# Patient Record
Sex: Male | Born: 1945 | Race: Black or African American | Hispanic: No | Smoking: Never smoker
Health system: Southern US, Community
[De-identification: ages and names within clinical notes are randomized; demographics above are authoritative.]

## PROBLEM LIST (undated history)

## (undated) DIAGNOSIS — E119 Type 2 diabetes mellitus without complications: Secondary | ICD-10-CM

## (undated) DIAGNOSIS — I10 Essential (primary) hypertension: Secondary | ICD-10-CM

## (undated) DIAGNOSIS — E079 Disorder of thyroid, unspecified: Secondary | ICD-10-CM

## (undated) DIAGNOSIS — G629 Polyneuropathy, unspecified: Secondary | ICD-10-CM

## (undated) HISTORY — PX: COLONOSCOPY W/ POLYPECTOMY: SHX1380

---

## 2019-07-18 ENCOUNTER — Emergency Department (HOSPITAL_BASED_OUTPATIENT_CLINIC_OR_DEPARTMENT_OTHER): Payer: Medicare Other

## 2019-07-18 ENCOUNTER — Other Ambulatory Visit: Payer: Self-pay

## 2019-07-18 ENCOUNTER — Inpatient Hospital Stay (HOSPITAL_BASED_OUTPATIENT_CLINIC_OR_DEPARTMENT_OTHER)
Admission: EM | Admit: 2019-07-18 | Discharge: 2019-07-20 | DRG: 149 | Disposition: A | Discharge status: Home / Self Care | Payer: Medicare Other | Attending: Internal Medicine | Admitting: Internal Medicine

## 2019-07-18 ENCOUNTER — Encounter (HOSPITAL_BASED_OUTPATIENT_CLINIC_OR_DEPARTMENT_OTHER): Payer: Self-pay | Admitting: Emergency Medicine

## 2019-07-18 DIAGNOSIS — N402 Nodular prostate without lower urinary tract symptoms: Secondary | ICD-10-CM | POA: Diagnosis present

## 2019-07-18 DIAGNOSIS — Z87891 Personal history of nicotine dependence: Secondary | ICD-10-CM

## 2019-07-18 DIAGNOSIS — Z7984 Long term (current) use of oral hypoglycemic drugs: Secondary | ICD-10-CM | POA: Diagnosis not present

## 2019-07-18 DIAGNOSIS — E1140 Type 2 diabetes mellitus with diabetic neuropathy, unspecified: Secondary | ICD-10-CM | POA: Diagnosis not present

## 2019-07-18 DIAGNOSIS — E785 Hyperlipidemia, unspecified: Secondary | ICD-10-CM | POA: Diagnosis present

## 2019-07-18 DIAGNOSIS — E039 Hypothyroidism, unspecified: Secondary | ICD-10-CM | POA: Diagnosis not present

## 2019-07-18 DIAGNOSIS — I10 Essential (primary) hypertension: Secondary | ICD-10-CM | POA: Diagnosis present

## 2019-07-18 DIAGNOSIS — Z79899 Other long term (current) drug therapy: Secondary | ICD-10-CM | POA: Diagnosis not present

## 2019-07-18 DIAGNOSIS — R439 Unspecified disturbances of smell and taste: Secondary | ICD-10-CM | POA: Diagnosis present

## 2019-07-18 DIAGNOSIS — R911 Solitary pulmonary nodule: Secondary | ICD-10-CM | POA: Diagnosis not present

## 2019-07-18 DIAGNOSIS — R42 Dizziness and giddiness: Secondary | ICD-10-CM | POA: Diagnosis not present

## 2019-07-18 DIAGNOSIS — E860 Dehydration: Secondary | ICD-10-CM | POA: Diagnosis present

## 2019-07-18 DIAGNOSIS — R0602 Shortness of breath: Secondary | ICD-10-CM

## 2019-07-18 DIAGNOSIS — Z9181 History of falling: Secondary | ICD-10-CM | POA: Diagnosis not present

## 2019-07-18 DIAGNOSIS — R2681 Unsteadiness on feet: Secondary | ICD-10-CM | POA: Diagnosis present

## 2019-07-18 DIAGNOSIS — Z7982 Long term (current) use of aspirin: Secondary | ICD-10-CM | POA: Diagnosis not present

## 2019-07-18 DIAGNOSIS — Z20822 Contact with and (suspected) exposure to covid-19: Secondary | ICD-10-CM | POA: Diagnosis present

## 2019-07-18 DIAGNOSIS — E119 Type 2 diabetes mellitus without complications: Secondary | ICD-10-CM

## 2019-07-18 DIAGNOSIS — R112 Nausea with vomiting, unspecified: Secondary | ICD-10-CM | POA: Diagnosis not present

## 2019-07-18 DIAGNOSIS — R7401 Elevation of levels of liver transaminase levels: Secondary | ICD-10-CM | POA: Diagnosis not present

## 2019-07-18 HISTORY — DX: Disorder of thyroid, unspecified: E07.9

## 2019-07-18 HISTORY — DX: Type 2 diabetes mellitus without complications: E11.9

## 2019-07-18 HISTORY — DX: Essential (primary) hypertension: I10

## 2019-07-18 HISTORY — DX: Polyneuropathy, unspecified: G62.9

## 2019-07-18 LAB — CBC WITH DIFFERENTIAL/PLATELET
Abs Immature Granulocytes: 0.02 10*3/uL (ref 0.00–0.07)
Basophils Absolute: 0 10*3/uL (ref 0.0–0.1)
Basophils Relative: 0 %
Eosinophils Absolute: 0 10*3/uL (ref 0.0–0.5)
Eosinophils Relative: 0 %
HCT: 40.5 % (ref 39.0–52.0)
Hemoglobin: 13.4 g/dL (ref 13.0–17.0)
Immature Granulocytes: 0 %
Lymphocytes Relative: 19 %
Lymphs Abs: 1.4 10*3/uL (ref 0.7–4.0)
MCH: 27.8 pg (ref 26.0–34.0)
MCHC: 33.1 g/dL (ref 30.0–36.0)
MCV: 84 fL (ref 80.0–100.0)
Monocytes Absolute: 0.7 10*3/uL (ref 0.1–1.0)
Monocytes Relative: 10 %
Neutro Abs: 5 10*3/uL (ref 1.7–7.7)
Neutrophils Relative %: 71 %
Platelets: 280 10*3/uL (ref 150–400)
RBC: 4.82 MIL/uL (ref 4.22–5.81)
RDW: 13.6 % (ref 11.5–15.5)
WBC: 7.2 10*3/uL (ref 4.0–10.5)
nRBC: 0 % (ref 0.0–0.2)

## 2019-07-18 LAB — URINALYSIS, ROUTINE W REFLEX MICROSCOPIC
Bilirubin Urine: NEGATIVE
Glucose, UA: 100 mg/dL — AB
Hgb urine dipstick: NEGATIVE
Ketones, ur: 15 mg/dL — AB
Leukocytes,Ua: NEGATIVE
Nitrite: NEGATIVE
Protein, ur: NEGATIVE mg/dL
Specific Gravity, Urine: 1.015 (ref 1.005–1.030)
pH: 5.5 (ref 5.0–8.0)

## 2019-07-18 LAB — COMPREHENSIVE METABOLIC PANEL
ALT: 121 U/L — ABNORMAL HIGH (ref 0–44)
AST: 68 U/L — ABNORMAL HIGH (ref 15–41)
Albumin: 3.9 g/dL (ref 3.5–5.0)
Alkaline Phosphatase: 92 U/L (ref 38–126)
Anion gap: 16 — ABNORMAL HIGH (ref 5–15)
BUN: 27 mg/dL — ABNORMAL HIGH (ref 8–23)
CO2: 22 mmol/L (ref 22–32)
Calcium: 10.8 mg/dL — ABNORMAL HIGH (ref 8.9–10.3)
Chloride: 106 mmol/L (ref 98–111)
Creatinine, Ser: 0.67 mg/dL (ref 0.61–1.24)
GFR calc Af Amer: >60 mL/min (ref >60)
GFR calc non Af Amer: >60 mL/min (ref >60)
Glucose, Bld: 175 mg/dL — ABNORMAL HIGH (ref 70–99)
Potassium: 3.7 mmol/L (ref 3.5–5.1)
Sodium: 144 mmol/L (ref 135–145)
Total Bilirubin: 1.2 mg/dL (ref 0.3–1.2)
Total Protein: 7.6 g/dL (ref 6.5–8.1)

## 2019-07-18 LAB — TROPONIN I (HIGH SENSITIVITY)
Troponin I (High Sensitivity): <2 ng/L (ref <18)
Troponin I (High Sensitivity): <2 ng/L (ref <18)

## 2019-07-18 LAB — CBG MONITORING, ED: Glucose-Capillary: 101 mg/dL — ABNORMAL HIGH (ref 70–99)

## 2019-07-18 LAB — SARS CORONAVIRUS 2 BY RT PCR (HOSPITAL ORDER, PERFORMED IN ~~LOC~~ HOSPITAL LAB): SARS Coronavirus 2: NEGATIVE

## 2019-07-18 MED ORDER — IOHEXOL 350 MG/ML SOLN
100.0000 mL | Freq: Once | INTRAVENOUS | Status: AC | PRN
  Administered 2019-07-18: 100 mL via INTRAVENOUS

## 2019-07-18 MED ORDER — SODIUM CHLORIDE 0.9 % IV BOLUS
1000.0000 mL | Freq: Once | INTRAVENOUS | Status: AC
  Administered 2019-07-18: 1000 mL via INTRAVENOUS

## 2019-07-18 MED ORDER — ONDANSETRON HCL 4 MG/2ML IJ SOLN
4.0000 mg | Freq: Once | INTRAMUSCULAR | Status: AC
  Administered 2019-07-18: 4 mg via INTRAVENOUS
  Filled 2019-07-18: qty 2

## 2019-07-18 MED ORDER — ATORVASTATIN CALCIUM 20 MG PO TABS
20.0000 mg | ORAL_TABLET | Freq: Every day | ORAL | Status: DC
  Administered 2019-07-19 – 2019-07-20 (×2): 20 mg via ORAL
  Filled 2019-07-18 (×2): qty 1

## 2019-07-18 MED ORDER — LACTATED RINGERS IV BOLUS
1000.0000 mL | Freq: Once | INTRAVENOUS | Status: AC
  Administered 2019-07-18: 1000 mL via INTRAVENOUS

## 2019-07-18 NOTE — ED Notes (Signed)
Pt.s orthostatic vitals were done. During pt.s orthostatic vitlas, pt was unsteady and stated that they felt dizzy. This Clinical research associate asked pt "what is spinning. The room or you?". Pt stated that they were spinning. Passenger transport manager and PA were notified.

## 2019-07-18 NOTE — ED Notes (Signed)
Pt laid flat for orthostatics 

## 2019-07-18 NOTE — ED Triage Notes (Signed)
Patient states that he fell off a ladder 2 weeks ago, he states that he continues to have nausea and constipation -  He states that he is not eating and drinking

## 2019-07-18 NOTE — ED Notes (Signed)
Car keys left at registration desk for patients wife to pick up per his request.

## 2019-07-18 NOTE — ED Notes (Signed)
Pt states that he fell off of a ladder 2 weeks ago. He was treated at [facility] and discharged. He was given meds for dizziness and told to take tylenol for pain. Pt reports dizziness with movement, nausea, and constipation. Last BM on Tuesday.

## 2019-07-18 NOTE — ED Notes (Signed)
Pt ambulated with assistance. O2 sats 100%. C/o of dizziness. Unable to ambulate alone.

## 2019-07-18 NOTE — ED Notes (Signed)
Will do orthostatics after the fluid bolus is completed.

## 2019-07-18 NOTE — ED Provider Notes (Signed)
[facility] EMERGENCY DEPARTMENT Provider Note   CSN: [id number] Arrival date & time: 07/18/19  1424     History Chief Complaint  Patient presents with  . Fall    Gerald Black is a 74 y.o. male with history of hyperlipidemia presents for evaluation of progressively worsening dizziness for 4 days.  He reports that approximately 2 weeks ago he fell off of a ladder from a height of around 3 to 4 feet, landing headfirst onto the ground.  Thinks he may have lost consciousness briefly.  He went to an outside hospital on 07/14/2019 for evaluation of occipital headaches with reassuring imaging and was discharged with lidocaine patches, Tylenol, meclizine.  He tells me he has not had a headache in several days.    Over the last 4 days he has felt progressively more weak, feels very lightheaded and unsteady with standing, sometimes room spinning sensation.  Denies vision changes, numbness or paresthesias of the extremities, abdominal pain.  He has had very decreased appetite and states he has not had anything to eat or drink for the last 3 days and has not had a bowel movement in 4 days.  He did have a single episode of emesis after attempting to eat crackers.  Today he noticed feeling short of breath with ambulation which he states is unusual for him.  Denies urinary symptoms, chest pain.  He has been taking the meclizine with a little bit of relief.  He is a non-smoker, quit 3 years ago, denies recreational drug use or excessive alcohol intake.  States he previously had a history of hypertension but after quitting a high stress job he was told by his PCP no longer needed to take blood pressure medications.  The history is provided by the patient.       Past Medical History:  Diagnosis Date  . Diabetes mellitus without complication (HCC)   . Hypertension   . Neuropathy   . Thyroid disease     Patient Active Problem List   Diagnosis Date Noted  . Vertigo 07/18/2019    Past Surgical  History:  Procedure Laterality Date  . COLONOSCOPY W/ POLYPECTOMY         No family history on file.  Social History   Tobacco Use  . Smoking status: Never Smoker  . Smokeless tobacco: Never Used  Substance Use Topics  . Alcohol use: Yes  . Drug use: Never    Home Medications Prior to Admission medications   Medication Sig Start Date End Date Taking? Authorizing Provider  aspirin 325 MG EC tablet Take 325 mg by mouth daily.   Yes [provider]  atorvastatin (LIPITOR) 20 MG tablet Take 20 mg by mouth daily.   Yes [provider]  empagliflozin (JARDIANCE) 10 MG TABS tablet Take by mouth daily.   Yes [provider]  traZODone (DESYREL) 50 MG tablet Take 50 mg by mouth at bedtime.   Yes [provider]    Allergies    Patient has no known allergies.  Review of Systems   Review of Systems  Constitutional: Negative for chills and fever.  Eyes: Negative for visual disturbance.  Respiratory: Positive for shortness of breath.   Cardiovascular: Negative for chest pain.  Gastrointestinal: Positive for constipation, nausea and vomiting. Negative for abdominal pain.  Neurological: Positive for dizziness and weakness (generalized).    Physical Exam Updated Vital Signs BP (!) 177/74 (BP Location: Right Arm)   Pulse (!) 104   Temp 99.1  F (37.3 C) (Oral)   Resp 20   Ht 5\' 6"  (1.676 m)   Wt 68 kg   SpO2 98%   BMI 24.21 kg/m   Physical Exam Vitals and nursing note reviewed.  Constitutional:      General: He is not in acute distress.    Appearance: He is well-developed.  HENT:     Head: Normocephalic and atraumatic.  Eyes:     General:        Right eye: No discharge.        Left eye: No discharge.     Extraocular Movements: Extraocular movements intact.     Conjunctiva/sclera: Conjunctivae normal.     Pupils: Pupils are equal, round, and reactive to light.     Comments: No nystagmus  Neck:     Vascular: No JVD.     Trachea:  No tracheal deviation.  Cardiovascular:     Rate and Rhythm: Regular rhythm. Tachycardia present.     Pulses: Normal pulses.     Comments: 2+ radial and DP/PT pulses bilaterally.  No lower extremity edema.  Compartments are soft. Pulmonary:     Comments: Patient tachypneic, worsens up to 40 breaths/min with ambulation.  Becomes markedly dyspneic upon standing. Chest:     Chest wall: No tenderness.  Abdominal:     General: Abdomen is flat. Bowel sounds are normal. There is no distension.     Palpations: Abdomen is soft.     Tenderness: There is no abdominal tenderness. There is no guarding or rebound.  Musculoskeletal:     Cervical back: Neck supple.  Skin:    General: Skin is warm and dry.     Findings: No erythema.  Neurological:     Mental Status: He is alert.     Comments: Mental Status:  Alert, thought content appropriate, able to give a coherent history. Speech fluent without evidence of aphasia. Able to follow 2 step commands without difficulty.  Cranial Nerves:  II:  Peripheral visual fields grossly normal, pupils equal, round, reactive to light III,IV, VI: ptosis not present, extra-ocular motions intact bilaterally  V,VII: smile symmetric, facial light touch sensation equal VIII: hearing grossly normal to voice  X: uvula elevates symmetrically  XI: bilateral shoulder shrug symmetric and strong XII: midline tongue extension without fassiculations Motor:  Normal tone. 5/5 strength of BUE and BLE major muscle groups including strong and equal grip strength and dorsiflexion/plantar flexion, no pronator drift Sensory: light touch normal in all extremities. Cerebellar: normal finger-to-nose with bilateral upper extremities Gait: ambulates with somewhat unsteady gait, especially upon initially standing.   Psychiatric:        Behavior: Behavior normal.     ED Results / Procedures / Treatments   Labs (all labs ordered are listed, but only abnormal results are  displayed) Labs Reviewed  URINALYSIS, ROUTINE W REFLEX MICROSCOPIC - Abnormal; Notable for the following components:      Result Value   Glucose, UA 100 (*)    Ketones, ur 15 (*)    All other components within normal limits  COMPREHENSIVE METABOLIC PANEL - Abnormal; Notable for the following components:   Glucose, Bld 175 (*)    BUN 27 (*)    Calcium 10.8 (*)    AST 68 (*)    ALT 121 (*)    Anion gap 16 (*)    All other components within normal limits  CBG MONITORING, ED - Abnormal; Notable for the following components:   Glucose-Capillary 101 (*)  All other components within normal limits  SARS CORONAVIRUS 2 BY RT PCR (HOSPITAL ORDER, PERFORMED IN [facility])  CBC WITH DIFFERENTIAL/PLATELET  TROPONIN I (HIGH SENSITIVITY)  TROPONIN I (HIGH SENSITIVITY)    EKG EKG Interpretation  Date/Time:  Sunday July 18 2019 14:35:18 EDT Ventricular Rate:  137 PR Interval:  138 QRS Duration: 70 QT Interval:  272 QTC Calculation: 410 R Axis:   63 Text Interpretation: Sinus tachycardia Left ventricular hypertrophy with repolarization abnormality ( Sokolow-Lyon ) Abnormal ECG Confirmed by Kennis Carina [id number]) on 07/18/2019 2:57:35 PM   Radiology CT Head Wo Contrast  Result Date: 07/18/2019 CLINICAL DATA:  74 year old male with continued headache, dizziness and nausea following fall and head injury 2 weeks ago. Initial encounter. EXAM: CT HEAD WITHOUT CONTRAST TECHNIQUE: Contiguous axial images were obtained from the base of the skull through the vertex without intravenous contrast. COMPARISON:  None. FINDINGS: Brain: No evidence of acute infarction, hemorrhage, hydrocephalus, extra-axial collection or mass lesion/mass effect. Moderate periventricular white matter hypodensities likely represents chronic small-vessel white matter ischemic changes. Vascular: No hyperdense vessel or unexpected calcification. Skull: Normal. Negative for fracture or focal lesion. Sinuses/Orbits: No  acute finding. Other: None. IMPRESSION: 1. No evidence of acute intracranial abnormality. 2. Moderate probable chronic small-vessel white matter ischemic changes. Electronically Signed   By: Harmon Pier M.D.   On: 07/18/2019 16:54   CT Angio Chest PE W and/or Wo Contrast  Result Date: 07/18/2019 CLINICAL DATA:  Larey Seat from ladder 2 weeks ago, dizziness, nausea, lethargy EXAM: CT ANGIOGRAPHY CHEST WITH CONTRAST TECHNIQUE: Multidetector CT imaging of the chest was performed using the standard protocol during bolus administration of intravenous contrast. Multiplanar CT image reconstructions and MIPs were obtained to evaluate the vascular anatomy. CONTRAST:  OMNIPAQUE IOHEXOL 350 MG/ML SOLN COMPARISON:  None. FINDINGS: Cardiovascular: This is a technically adequate evaluation of the pulmonary vasculature. No fluid view flex or pulmonary emboli. The heart is unremarkable without pericardial effusion. The thoracic aorta is grossly normal. Mediastinum/Nodes: No enlarged mediastinal, hilar, or axillary lymph nodes. Thyroid gland, trachea, and esophagus demonstrate no significant findings. Lungs/Pleura: There is a pure ground-glass 9 mm right upper lobe subpleural nodule reference image 35/7. No other areas of airspace disease, effusion, or pneumothorax. Central airways are patent. Upper Abdomen: No acute abnormality. Musculoskeletal: No acute or destructive bony lesions. Reconstructed images demonstrate no additional findings. Review of the MIP images confirms the above findings. IMPRESSION: 1. No evidence of pulmonary embolus. 2. A 9 mm right upper lobe subpleural ground-glass nodule. Initial follow-up with CT at 6-12 months is recommended to confirm persistence. If persistent, repeat CT is recommended every 2 years until 5 years of stability has been established. This recommendation follows the consensus statement: Guidelines for Management of Incidental Pulmonary Nodules Detected on CT Images: From the Fleischner  Society 2017; Radiology 2017; 284:228-243. Electronically Signed   By: Sharlet Salina M.D.   On: 07/18/2019 16:55   CT ABDOMEN PELVIS W CONTRAST  Result Date: 07/18/2019 CLINICAL DATA:  Larey Seat from ladder 2 weeks ago, weight loss, decreased appetite EXAM: CT ABDOMEN AND PELVIS WITH CONTRAST TECHNIQUE: Multidetector CT imaging of the abdomen and pelvis was performed using the standard protocol following bolus administration of intravenous contrast. CONTRAST:  OMNIPAQUE IOHEXOL 350 MG/ML SOLN COMPARISON:  None. FINDINGS: Lower chest: No acute pleural or parenchymal lung disease. Hepatobiliary: No hepatic injury or perihepatic hematoma. Gallbladder is unremarkable Pancreas: Unremarkable. No pancreatic ductal dilatation or surrounding inflammatory changes. Spleen: No splenic injury  or perisplenic hematoma. Adrenals/Urinary Tract: No adrenal hemorrhage or renal injury identified. Bladder is unremarkable. Stomach/Bowel: No bowel obstruction or ileus. No bowel wall thickening or inflammatory change. Vascular/Lymphatic: Aortic atherosclerosis. No enlarged abdominal or pelvic lymph nodes. Reproductive: Mildly enlarged prostate. 1.2 cm hypodense nodule posterior aspect of the prostate, nonspecific. Please correlate with PSA and physical exam findings. Other: No abdominal wall hernia or abnormality. No abdominopelvic ascites. Musculoskeletal: No acute or destructive bony lesions. Reconstructed images demonstrate no additional findings. IMPRESSION: 1. No acute intra-abdominal or intrapelvic process. 2. Mildly enlarged prostate. 1.2 cm hypodense nodule posterior aspect of the prostate, nonspecific. Please correlate with PSA and physical exam findings. 3. Aortic Atherosclerosis (ICD10-I70.0). Electronically Signed   By: Sharlet Salina M.D.   On: 07/18/2019 16:59   DG Chest Portable 1 View  Result Date: 07/18/2019 CLINICAL DATA:  Pain status post fall from ladder. EXAM: PORTABLE CHEST 1 VIEW COMPARISON:  None.  FINDINGS: There is a nodular opacity in the left mid lung zone. There is no pneumothorax. No large pleural effusion. The heart size is unremarkable. There are aortic calcifications. There is no focal infiltrate. No acute displaced fracture. IMPRESSION: 1. No acute cardiopulmonary process. 2. Nodular opacity in the left mid lung zone. Follow-up with a nonemergent outpatient two-view chest x-ray is recommended. Electronically Signed   By: Katherine Mantle M.D.   On: 07/18/2019 15:50    Procedures Procedures (including critical care time)  Medications Ordered in ED Medications  atorvastatin (LIPITOR) tablet 20 mg (has no administration in time range)  sodium chloride 0.9 % bolus 1,000 mL (0 mLs Intravenous Stopped 07/18/19 1736)  ondansetron (ZOFRAN) injection 4 mg (4 mg Intravenous Given 07/18/19 1532)  iohexol (OMNIPAQUE) 350 MG/ML injection 100 mL (100 mLs Intravenous Contrast Given 07/18/19 1615)  sodium chloride 0.9 % bolus 1,000 mL (0 mLs Intravenous Stopped 07/18/19 1855)  lactated ringers bolus 1,000 mL (0 mLs Intravenous Stopped 07/18/19 2056)    ED Course  I have reviewed the triage vital signs and the nursing notes.  Pertinent labs & imaging results that were available during my care of the patient were reviewed by me and considered in my medical decision making (see chart for details).    MDM Rules/Calculators/A&P                          Patient presenting for evaluation of 4 days of progressively worsening dizziness, difficulty ambulating.  He sustained a fall approximately 2 weeks ago off of a ladder and was evaluated at an different emergency department at an outside hospital on 07/14/2019 with reassuring imaging at that time.  Since then he has had some occipital headaches though these have been improving and he has not had 1 in several days.  He reports feeling markedly unsteady upon standing but is able to walk a few steps on my initial assessment.  He is afebrile, vital signs  initially notable for tachycardia with heart rate up to almost 150 bpm.  He was also tachypneic up to 40 breaths/min especially with standing and attempt to ambulate.  He does endorse dyspnea on exertion which is new for him.  His EKG shows sinus tachycardia, no acute ischemic abnormalities.  Serial troponins are negative and I doubt ACS/MI at this time in the absence of chest pain.  Lab work reviewed and interpreted by myself shows elevated BUN but creatinine within normal limits.  I suspect he is likely dehydrated.  No leukocytosis or anemia  noted.  His LFTs are mildly elevated but he has no abdominal tenderness on examination I am unsure of the clinical significance of this.  UA shows mild ketones and glucose consistent with dehydration.  No concern for UTI at this time.  Repeat head imaging shows no acute intracranial abnormalities.  He underwent PE study and CT abdomen and pelvis given his marked tachycardia and tachypnea and in the setting of recent trauma to rule out PE or other internal injuries.  Imaging is reassuring with no evidence of PE though he does have a 9mm subpleural lung nodule.  No evidence of acute intra-abdominal or intrapelvic process.  His gallbladder shows no evidence of gallstones, gallbladder wall thickening or other hepatobiliary dysfunction.  While in the ED despite receiving 2 L of normal saline and 1 L of LR the patient had only marginal improvement in his tachycardia.  His tachycardia worsens with standing and attempt to ambulate.  Attempted to ambulate the patient in order to obtain ambulatory SPO2 saturations but he reported feeling unsteady.  I suspect that he is markedly dehydrated likely contributing to most of his symptoms though we also considered the possibility of a posterior circulation stroke.  He is outside of the window for any intervention if this is the case.   Discussed with Dr. Antionette Char with [facility] hospitalist service who agrees to assume care of patient and  bring him into the hospital for further evaluation and management.  Patient was seen and evaluated by Dr. Pilar Plate who agrees with assessment and plan at this time.   Final Clinical Impression(s) / ED Diagnoses Final diagnoses:  Dehydration  Unstable gait  SOB (shortness of breath)  Hypertension, unspecified type    Rx / DC Orders ED Discharge Orders    None       Jeanie Sewer, PA-C 07/18/19 2304    Sabas Sous, MD 07/18/19 2342

## 2019-07-18 NOTE — ED Notes (Signed)
Pt.s CBG checked. CBG=101. Cindy RN notified.

## 2019-07-18 NOTE — ED Notes (Signed)
Patient transported to CT 

## 2019-07-19 ENCOUNTER — Other Ambulatory Visit: Payer: Self-pay

## 2019-07-19 ENCOUNTER — Observation Stay (HOSPITAL_COMMUNITY): Payer: Medicare Other

## 2019-07-19 ENCOUNTER — Encounter (HOSPITAL_COMMUNITY): Payer: Self-pay | Admitting: Family Medicine

## 2019-07-19 DIAGNOSIS — R112 Nausea with vomiting, unspecified: Secondary | ICD-10-CM | POA: Diagnosis present

## 2019-07-19 DIAGNOSIS — E039 Hypothyroidism, unspecified: Secondary | ICD-10-CM | POA: Diagnosis present

## 2019-07-19 DIAGNOSIS — R911 Solitary pulmonary nodule: Secondary | ICD-10-CM | POA: Diagnosis present

## 2019-07-19 DIAGNOSIS — N402 Nodular prostate without lower urinary tract symptoms: Secondary | ICD-10-CM | POA: Diagnosis present

## 2019-07-19 DIAGNOSIS — E860 Dehydration: Secondary | ICD-10-CM

## 2019-07-19 DIAGNOSIS — R2681 Unsteadiness on feet: Secondary | ICD-10-CM | POA: Diagnosis present

## 2019-07-19 DIAGNOSIS — Z20822 Contact with and (suspected) exposure to covid-19: Secondary | ICD-10-CM | POA: Diagnosis present

## 2019-07-19 DIAGNOSIS — I10 Essential (primary) hypertension: Secondary | ICD-10-CM | POA: Diagnosis present

## 2019-07-19 DIAGNOSIS — E1140 Type 2 diabetes mellitus with diabetic neuropathy, unspecified: Secondary | ICD-10-CM | POA: Diagnosis present

## 2019-07-19 DIAGNOSIS — R7401 Elevation of levels of liver transaminase levels: Secondary | ICD-10-CM | POA: Diagnosis present

## 2019-07-19 DIAGNOSIS — Z7982 Long term (current) use of aspirin: Secondary | ICD-10-CM | POA: Diagnosis not present

## 2019-07-19 DIAGNOSIS — Z7984 Long term (current) use of oral hypoglycemic drugs: Secondary | ICD-10-CM | POA: Diagnosis not present

## 2019-07-19 DIAGNOSIS — R42 Dizziness and giddiness: Secondary | ICD-10-CM | POA: Diagnosis present

## 2019-07-19 DIAGNOSIS — Z9181 History of falling: Secondary | ICD-10-CM | POA: Diagnosis not present

## 2019-07-19 DIAGNOSIS — E785 Hyperlipidemia, unspecified: Secondary | ICD-10-CM | POA: Diagnosis present

## 2019-07-19 DIAGNOSIS — Z87891 Personal history of nicotine dependence: Secondary | ICD-10-CM | POA: Diagnosis not present

## 2019-07-19 DIAGNOSIS — E119 Type 2 diabetes mellitus without complications: Secondary | ICD-10-CM | POA: Diagnosis not present

## 2019-07-19 DIAGNOSIS — R439 Unspecified disturbances of smell and taste: Secondary | ICD-10-CM | POA: Diagnosis present

## 2019-07-19 DIAGNOSIS — Z79899 Other long term (current) drug therapy: Secondary | ICD-10-CM | POA: Diagnosis not present

## 2019-07-19 DIAGNOSIS — R0602 Shortness of breath: Secondary | ICD-10-CM

## 2019-07-19 LAB — COMPREHENSIVE METABOLIC PANEL
ALT: 82 U/L — ABNORMAL HIGH (ref 0–44)
AST: 43 U/L — ABNORMAL HIGH (ref 15–41)
Albumin: 3.2 g/dL — ABNORMAL LOW (ref 3.5–5.0)
Alkaline Phosphatase: 66 U/L (ref 38–126)
Anion gap: 12 (ref 5–15)
BUN: 23 mg/dL (ref 8–23)
CO2: 22 mmol/L (ref 22–32)
Calcium: 9.8 mg/dL (ref 8.9–10.3)
Chloride: 112 mmol/L — ABNORMAL HIGH (ref 98–111)
Creatinine, Ser: 0.65 mg/dL (ref 0.61–1.24)
GFR calc Af Amer: >60 mL/min (ref >60)
GFR calc non Af Amer: >60 mL/min (ref >60)
Glucose, Bld: 109 mg/dL — ABNORMAL HIGH (ref 70–99)
Potassium: 3.4 mmol/L — ABNORMAL LOW (ref 3.5–5.1)
Sodium: 146 mmol/L — ABNORMAL HIGH (ref 135–145)
Total Bilirubin: 1.4 mg/dL — ABNORMAL HIGH (ref 0.3–1.2)
Total Protein: 5.8 g/dL — ABNORMAL LOW (ref 6.5–8.1)

## 2019-07-19 LAB — CBC
HCT: 33.2 % — ABNORMAL LOW (ref 39.0–52.0)
Hemoglobin: 11 g/dL — ABNORMAL LOW (ref 13.0–17.0)
MCH: 28.4 pg (ref 26.0–34.0)
MCHC: 33.1 g/dL (ref 30.0–36.0)
MCV: 85.6 fL (ref 80.0–100.0)
Platelets: 210 10*3/uL (ref 150–400)
RBC: 3.88 MIL/uL — ABNORMAL LOW (ref 4.22–5.81)
RDW: 14 % (ref 11.5–15.5)
WBC: 6.4 10*3/uL (ref 4.0–10.5)
nRBC: 0 % (ref 0.0–0.2)

## 2019-07-19 LAB — HEMOGLOBIN A1C
Hgb A1c MFr Bld: 7 % — ABNORMAL HIGH (ref 4.8–5.6)
Mean Plasma Glucose: 154.2 mg/dL

## 2019-07-19 LAB — GLUCOSE, CAPILLARY
Glucose-Capillary: 114 mg/dL — ABNORMAL HIGH (ref 70–99)
Glucose-Capillary: 103 mg/dL — ABNORMAL HIGH (ref 70–99)
Glucose-Capillary: 114 mg/dL — ABNORMAL HIGH (ref 70–99)
Glucose-Capillary: 120 mg/dL — ABNORMAL HIGH (ref 70–99)

## 2019-07-19 LAB — VITAMIN B12: Vitamin B-12: 1380 pg/mL — ABNORMAL HIGH (ref 180–914)

## 2019-07-19 LAB — PSA: Prostatic Specific Antigen: 1.23 ng/mL (ref 0.00–4.00)

## 2019-07-19 MED ORDER — ACETAMINOPHEN 650 MG RE SUPP: 650.0000 mg | Freq: Four times a day (QID) | RECTAL | Status: DC | PRN

## 2019-07-19 MED ORDER — INSULIN ASPART 100 UNIT/ML ~~LOC~~ SOLN: 0.0000 [IU] | Freq: Every day | SUBCUTANEOUS | Status: DC

## 2019-07-19 MED ORDER — INSULIN ASPART 100 UNIT/ML ~~LOC~~ SOLN: 0.0000 [IU] | Freq: Three times a day (TID) | SUBCUTANEOUS | Status: DC

## 2019-07-19 MED ORDER — TRAZODONE HCL 50 MG PO TABS
50.0000 mg | ORAL_TABLET | Freq: Every day | ORAL | Status: DC
  Administered 2019-07-19: 50 mg via ORAL
  Filled 2019-07-19: qty 1

## 2019-07-19 MED ORDER — ASPIRIN EC 325 MG PO TBEC
325.0000 mg | DELAYED_RELEASE_TABLET | Freq: Every day | ORAL | Status: DC
  Administered 2019-07-19 – 2019-07-20 (×2): 325 mg via ORAL
  Filled 2019-07-19 (×2): qty 1

## 2019-07-19 MED ORDER — ONDANSETRON HCL 4 MG PO TABS: 4.0000 mg | ORAL_TABLET | Freq: Four times a day (QID) | ORAL | Status: DC | PRN

## 2019-07-19 MED ORDER — ENOXAPARIN SODIUM 40 MG/0.4ML ~~LOC~~ SOLN
40.0000 mg | SUBCUTANEOUS | Status: DC
  Administered 2019-07-19 – 2019-07-20 (×2): 40 mg via SUBCUTANEOUS
  Filled 2019-07-19 (×2): qty 0.4

## 2019-07-19 MED ORDER — SODIUM CHLORIDE 0.9 % IV SOLN: INTRAVENOUS | Status: DC

## 2019-07-19 MED ORDER — LACTATED RINGERS IV SOLN: INTRAVENOUS | Status: DC

## 2019-07-19 MED ORDER — ONDANSETRON HCL 4 MG/2ML IJ SOLN: 4.0000 mg | Freq: Four times a day (QID) | INTRAMUSCULAR | Status: DC | PRN

## 2019-07-19 MED ORDER — ACETAMINOPHEN 325 MG PO TABS
650.0000 mg | ORAL_TABLET | Freq: Four times a day (QID) | ORAL | Status: DC | PRN
  Administered 2019-07-19: 650 mg via ORAL
  Filled 2019-07-19 (×2): qty 2

## 2019-07-19 MED ORDER — MECLIZINE HCL 25 MG PO TABS: 25.0000 mg | ORAL_TABLET | Freq: Three times a day (TID) | ORAL | Status: DC | PRN

## 2019-07-19 NOTE — Assessment & Plan Note (Signed)
-  Downtrending from admission.  Unclear etiology -Continue trending.  No further work-up unless does not improve, then will consider ultrasound

## 2019-07-19 NOTE — Assessment & Plan Note (Signed)
-  Incidental finding on CT abdomen/pelvis -PSA normal -1.2 cm hypodense nodule posterior aspect of the prostate, nonspecific -Outpatient follow-up

## 2019-07-19 NOTE — H&P (Signed)
History and Physical    Gerald Black EYC:[id number] DOB: Nov 24, 1945 DOA: 07/18/2019  PCP: System, Pcp Not In  Patient coming from: Home  I have personally briefly reviewed patient's old medical records in [place]  Chief Complaint: Vertigo  HPI: Gerald Black is a 74 y.o. male with medical history significant of DM2, HTN.  Pt with progressively worsening dizziness for 4 days.  [place] off of a ladder 2 weeks ago, landed on head on ground.  ? LOC.  Went to OSH on 07/14/19 for occipital headaches, had reassuring imaging.  No headache in several days.  Over last 4 days having progressive weakness.  Lightheaded and unsteady on feet.  Having N/V.  No vision changes, numbness, focal weakness.  No PO intake in past 3 days, no BM in 4 days.  Meclizine provides minor relief.  No urinary symptoms, no CP, no SOB.  Off HTN meds, pcp told him he didn't need them anymore.   ED Course: SBPs 160s-170s.  HR 105.  No fever, no WBC.  BUN 27, creat 0.67.  Calcium 10.8.  CXR neg, UA neg.  CTA neg for PE.  Has 41m RUL pulm nodule.  CT abd/pelvis: nothing acute: 1.2 cm hypodense nodule posterior aspect of the prostate,  CT head: chronic small vessel dz.    Review of Systems: As per HPI, otherwise all review of systems negative.  Past Medical History:  Diagnosis Date  . Diabetes mellitus without complication ([place]   . Hypertension   . Neuropathy   . Thyroid disease     Past Surgical History:  Procedure Laterality Date  . COLONOSCOPY W/ POLYPECTOMY       reports that he has never smoked. He has never used smokeless tobacco. He reports current alcohol use. He reports that he does not use drugs.  No Known Allergies  No family history on file. No sick contacts  Prior to Admission medications   Medication Sig Start Date End Date Taking? Authorizing Provider  aspirin 325 MG EC tablet Take 325 mg by mouth daily.   Yes [provider]  atorvastatin (LIPITOR) 20 MG tablet Take  20 mg by mouth daily.   Yes [provider]  empagliflozin (JARDIANCE) 10 MG TABS tablet Take by mouth daily.   Yes [provider]  traZODone (DESYREL) 50 MG tablet Take 50 mg by mouth at bedtime.   Yes [provider]    Physical Exam: Vitals:   07/18/19 1859 07/18/19 1929 07/18/19 2041 07/19/19 0054  BP: (!) 172/75 (!) 168/151 (!) 177/74 (!) 166/69  Pulse: (!) 105  (!) 104 (!) 103  Resp: (!) _0 Temp:    98.2 F (36.8 C)  TempSrc:      SpO2: 100%  98% 100%  Weight:      Height:        Constitutional: NAD, calm, comfortable Eyes: PERRL, lids and conjunctivae normal ENMT: Mucous membranes are moist. Posterior pharynx clear of any exudate or lesions.Normal dentition.  Neck: normal, supple, no masses, no thyromegaly Respiratory: clear to auscultation bilaterally, no wheezing, no crackles. Normal respiratory effort. No accessory muscle use.  Cardiovascular: Regular rate and rhythm, no murmurs / rubs / gallops. No extremity edema. 2+ pedal pulses. No carotid bruits.  Abdomen: no tenderness, no masses palpated. No hepatosplenomegaly. Bowel sounds positive.  Musculoskeletal: no clubbing / cyanosis. No joint deformity upper and lower extremities. Good ROM, no contractures. Normal muscle tone.  Skin: no rashes, lesions, ulcers. No  induration Neurologic: CN 2-12 grossly intact. Sensation intact, DTR normal. Strength 5/5 in all 4.  Psychiatric: Normal judgment and insight. Alert and oriented x 3. Normal mood.    Labs on Admission: I have personally reviewed following labs and imaging studies  CBC: Recent Labs  Lab 07/18/19 1512  WBC 7.2  NEUTROABS 5.0  HGB 13.4  HCT 40.5  MCV 84.0  PLT 654   Basic Metabolic Panel: Recent Labs  Lab 07/18/19 1512  NA 144  K 3.7  CL 106  CO2 22  GLUCOSE 175*  BUN 27*  CREATININE 0.67  CALCIUM 10.8*   GFR: Estimated Creatinine Clearance: 74.2 mL/min (by C-G formula based on SCr of 0.67 mg/dL). Liver  Function Tests: Recent Labs  Lab 07/18/19 1512  AST 68*  ALT 121*  ALKPHOS 92  BILITOT 1.2  PROT 7.6  ALBUMIN 3.9   No results for input(s): LIPASE, AMYLASE in the last 168 hours. No results for input(s): AMMONIA in the last 168 hours. Coagulation Profile: No results for input(s): INR, PROTIME in the last 168 hours. Cardiac Enzymes: No results for input(s): CKTOTAL, CKMB, CKMBINDEX, TROPONINI in the last 168 hours. BNP (last 3 results) No results for input(s): PROBNP in the last 8760 hours. HbA1C: No results for input(s): HGBA1C in the last 72 hours. CBG: Recent Labs  Lab 07/18/19 1540  GLUCAP 101*   Lipid Profile: No results for input(s): CHOL, HDL, LDLCALC, TRIG, CHOLHDL, LDLDIRECT in the last 72 hours. Thyroid Function Tests: No results for input(s): TSH, T4TOTAL, FREET4, T3FREE, THYROIDAB in the last 72 hours. Anemia Panel: No results for input(s): VITAMINB12, FOLATE, FERRITIN, TIBC, IRON, RETICCTPCT in the last 72 hours. Urine analysis:    Component Value Date/Time   COLORURINE YELLOW 07/18/2019 1513   APPEARANCEUR CLEAR 07/18/2019 1513   LABSPEC 1.015 07/18/2019 1513   PHURINE 5.5 07/18/2019 1513   GLUCOSEU 100 (A) 07/18/2019 1513   HGBUR NEGATIVE 07/18/2019 1513   BILIRUBINUR NEGATIVE 07/18/2019 1513   KETONESUR 15 (A) 07/18/2019 1513   PROTEINUR NEGATIVE 07/18/2019 1513   NITRITE NEGATIVE 07/18/2019 1513   LEUKOCYTESUR NEGATIVE 07/18/2019 1513    Radiological Exams on Admission: CT Head Wo Contrast  Result Date: 07/18/2019 CLINICAL DATA:  74 year old male with continued headache, dizziness and nausea following fall and head injury 2 weeks ago. Initial encounter. EXAM: CT HEAD WITHOUT CONTRAST TECHNIQUE: Contiguous axial images were obtained from the base of the skull through the vertex without intravenous contrast. COMPARISON:  None. FINDINGS: Brain: No evidence of acute infarction, hemorrhage, hydrocephalus, extra-axial collection or mass lesion/mass  effect. Moderate periventricular white matter hypodensities likely represents chronic small-vessel white matter ischemic changes. Vascular: No hyperdense vessel or unexpected calcification. Skull: Normal. Negative for fracture or focal lesion. Sinuses/Orbits: No acute finding. Other: None. IMPRESSION: 1. No evidence of acute intracranial abnormality. 2. Moderate probable chronic small-vessel white matter ischemic changes. Electronically Signed   By: Margarette Canada M.D.   On: 07/18/2019 16:54   CT Angio Chest PE W and/or Wo Contrast  Result Date: 07/18/2019 CLINICAL DATA:  [place] from ladder 2 weeks ago, dizziness, nausea, lethargy EXAM: CT ANGIOGRAPHY CHEST WITH CONTRAST TECHNIQUE: Multidetector CT imaging of the chest was performed using the standard protocol during bolus administration of intravenous contrast. Multiplanar CT image reconstructions and MIPs were obtained to evaluate the vascular anatomy. CONTRAST:  130m OMNIPAQUE IOHEXOL 350 MG/ML SOLN COMPARISON:  None. FINDINGS: Cardiovascular: This is a technically adequate evaluation of the pulmonary vasculature. No fluid view flex or pulmonary  emboli. The heart is unremarkable without pericardial effusion. The thoracic aorta is grossly normal. Mediastinum/Nodes: No enlarged mediastinal, hilar, or axillary lymph nodes. Thyroid gland, trachea, and esophagus demonstrate no significant findings. Lungs/Pleura: There is a pure ground-glass 9 mm right upper lobe subpleural nodule reference image 35/7. No other areas of airspace disease, effusion, or pneumothorax. Central airways are patent. Upper Abdomen: No acute abnormality. Musculoskeletal: No acute or destructive bony lesions. Reconstructed images demonstrate no additional findings. Review of the MIP images confirms the above findings. IMPRESSION: 1. No evidence of pulmonary embolus. 2. A 9 mm right upper lobe subpleural ground-glass nodule. Initial follow-up with CT at 6-12 months is recommended to confirm  persistence. If persistent, repeat CT is recommended every 2 years until 5 years of stability has been established. This recommendation follows the consensus statement: Guidelines for Management of Incidental Pulmonary Nodules Detected on CT Images: From the Fleischner Society 2017; Radiology 2017; 284:228-243. Electronically Signed   By: Randa Ngo M.D.   On: 07/18/2019 16:55   CT ABDOMEN PELVIS W CONTRAST  Result Date: 07/18/2019 CLINICAL DATA:  [place] from ladder 2 weeks ago, weight loss, decreased appetite EXAM: CT ABDOMEN AND PELVIS WITH CONTRAST TECHNIQUE: Multidetector CT imaging of the abdomen and pelvis was performed using the standard protocol following bolus administration of intravenous contrast. CONTRAST:  121m OMNIPAQUE IOHEXOL 350 MG/ML SOLN COMPARISON:  None. FINDINGS: Lower chest: No acute pleural or parenchymal lung disease. Hepatobiliary: No hepatic injury or perihepatic hematoma. Gallbladder is unremarkable Pancreas: Unremarkable. No pancreatic ductal dilatation or surrounding inflammatory changes. Spleen: No splenic injury or perisplenic hematoma. Adrenals/Urinary Tract: No adrenal hemorrhage or renal injury identified. Bladder is unremarkable. Stomach/Bowel: No bowel obstruction or ileus. No bowel wall thickening or inflammatory change. Vascular/Lymphatic: Aortic atherosclerosis. No enlarged abdominal or pelvic lymph nodes. Reproductive: Mildly enlarged prostate. 1.2 cm hypodense nodule posterior aspect of the prostate, nonspecific. Please correlate with PSA and physical exam findings. Other: No abdominal wall hernia or abnormality. No abdominopelvic ascites. Musculoskeletal: No acute or destructive bony lesions. Reconstructed images demonstrate no additional findings. IMPRESSION: 1. No acute intra-abdominal or intrapelvic process. 2. Mildly enlarged prostate. 1.2 cm hypodense nodule posterior aspect of the prostate, nonspecific. Please correlate with PSA and physical exam findings. 3.  Aortic Atherosclerosis (ICD10-I70.0). Electronically Signed   By: MRanda NgoM.D.   On: 07/18/2019 16:59   DG Chest Portable 1 View  Result Date: 07/18/2019 CLINICAL DATA:  Pain status post fall from ladder. EXAM: PORTABLE CHEST 1 VIEW COMPARISON:  None. FINDINGS: There is a nodular opacity in the left mid lung zone. There is no pneumothorax. No large pleural effusion. The heart size is unremarkable. There are aortic calcifications. There is no focal infiltrate. No acute displaced fracture. IMPRESSION: 1. No acute cardiopulmonary process. 2. Nodular opacity in the left mid lung zone. Follow-up with a nonemergent outpatient two-view chest x-ray is recommended. Electronically Signed   By: CConstance HolsterM.D.   On: 07/18/2019 15:50    EKG: Independently reviewed.  Assessment/Plan Principal Problem:   Vertigo Active Problems:   Nausea & vomiting   Transaminitis   DM2 (diabetes mellitus, type 2) (HCC)   HTN (hypertension)   Pulmonary nodule   Prostate nodule    1. Vertigo - 1. antivert PRN 2. Check MRI brain in AM to r/o stroke 3. IVF: 3L bolus in ED and 100 cc/hr NS 4. Repeat CBC/BMP in AM 2. N/V - 1. Zofran PRN 2. Try to advance diet as tolerated 3. Transaminitis -  1. Mild LFT elevation 2. Repeat CMP in AM 3. Consider RUQ [place] if still elevated 4. Nothing found on CT though 5. And nl alk phos 4. Pulmonary nodule - 1. Needs repeat CT chest in 6-12 months 5. Prostate nodule - 1. Getting PSA 6. DM2 - 1. Hold Jardiance 2. Sensitive SSI AC/HS 7. HTN - 1. Not on any home meds 2. Will order PRN labetalol for SBP > 180 or DBP > 110 for the moment  DVT prophylaxis: Lovenox Code Status: Full Family Communication: No family in room Disposition Plan: Home after dizziness improved Consults called: None Admission status: Place in 58     Bettina Warn, [facility]  How to contact the [facility] Attending or Consulting provider [place] or covering provider during  after hours [place], for this patient?  1. Check the care team in [facility] and look for a) attending/consulting [facility] provider listed and b) the [facility] team listed 2. Log into [email or web address]  Amion Physician Scheduling and messaging for groups and whole hospitals  On call and physician scheduling software for group practices, residents, hospitalists and other medical providers for call, clinic, rotation and shift schedules. On[employer] is a hospital-wide system for scheduling doctors and paging doctors on call. EasyPlot is for scientific plotting and data analysis.  [email or web address]  and use [place]'s universal password to access. If you do not have the password, please contact the hospital operator.  3. Locate the [facility] provider you are looking for under [facility] and page to a number that you can be directly reached. 4. If you still have difficulty reaching the provider, please page the [facility] (Director on Call) for the Hospitalists listed on amion for assistance.  07/19/2019, 1:16 AM

## 2019-07-19 NOTE — Assessment & Plan Note (Signed)
-  Incidental finding on CT chest - 9 mm right upper lobe subpleural ground-glass nodule -Outpatient follow-up and repeat CT chest at 6 months

## 2019-07-19 NOTE — Care Management Obs Status (Signed)
MEDICARE OBSERVATION STATUS NOTIFICATION   Patient Details  Name: Gerald Black MRN: [id number] Date of Birth: 13-Apr-1945   Medicare Observation Status Notification Given:  Yes    Lanier Clam, RN 07/19/2019, 12:36 PM

## 2019-07-19 NOTE — Progress Notes (Signed)
PROGRESS NOTE    Thaine Garriga   ZDG:[id number]  DOB: 07-May-1945  DOA: 07/18/2019     0  PCP: System, Pcp Not In  CC: dizzy  Hospital Course: Mr. Kestenbaum is a 74 year old male with PMH hypothyroidism, hypertension, type 2 diabetes who presents to the hospital with ongoing dizziness/vertigo.  He had stated that he fell off of a ladder approximately 2 weeks prior to admission and has been becoming weaker at home.  He also endorsed that he has had a very decreased appetite over the past 4 days prior to admission.  Reason being is due to loss of taste.  He denied any other fevers, chills, loss of smell.  He has also been vaccinated for COVID-19 and also had a negative COVID-19 test on admission. He was admitted for stroke rule out with MRI brain.  This was performed after admission and did not reveal any acute abnormalities.  It was notable for chronic small vessel disease otherwise unremarkable.  He was evaluated by physical therapy as well with no needs at discharge.   Interval History:  Admitted yesterday with ongoing dizziness at home.  Stroke work-up has been negative.  He is feeling a little better this morning.  Discussed that his poor appetite over the past several days is also likely contributing to his symptoms.  He states since he cannot taste, this has made him not have much of an appetite.  Again reinforced importance of trying to intake adequate nutrition.  Old records reviewed in assessment of this patient  ROS: Constitutional: negative for chills and fatigue, Respiratory: negative for cough, Cardiovascular: negative for chest pain and Gastrointestinal: negative for abdominal pain  Assessment & Plan: DM2 (diabetes mellitus, type 2) (HCC) Continue SSI and POC glucose  HTN (hypertension) Continue current regimen  Vertigo Stroke ruled out on MRI brain -Continue supportive care and meclizine as needed -PT consult.  No acute needs at discharge -Monitor further overnight and  likely able to discharge home tomorrow  Nausea & vomiting Continue antiemetic -See vertigo  Transaminitis -Downtrending from admission.  Unclear etiology -Continue trending.  No further work-up unless does not improve, then will consider ultrasound  Pulmonary nodule -Incidental finding on CT chest - 9 mm right upper lobe subpleural ground-glass nodule -Outpatient follow-up and repeat CT chest at 6 months  Prostate nodule -Incidental finding on CT abdomen/pelvis -PSA normal -1.2 cm hypodense nodule posterior aspect of the prostate, nonspecific -Outpatient follow-up   Antimicrobials: N/A  DVT prophylaxis: Lovenox Code Status: Full Family Communication: None Disposition Plan:  Status is: Observation  The patient remains OBS appropriate and will d/c before 2 midnights.  Dispo: The patient is from: Home              Anticipated d/c is to: Home              Anticipated d/c date is: 1 day              Patient currently is not medically stable to d/c.       Objective: Blood pressure (!) 152/66, pulse (!) 105, temperature 98.2 F (36.8 C), temperature source Oral, resp. rate (!) 25, height  (1.676 m), weight 68 kg, SpO2 99 %.  Examination: General appearance: alert, cooperative, fatigued and no distress Head: Normocephalic, without obvious abnormality Eyes: EOMI Lungs: clear to auscultation bilaterally Heart: regular rate and rhythm and S1, S2 normal Abdomen: normal findings: bowel sounds normal and soft, non-tender Extremities: No edema Skin: mobility and turgor  normal Neurologic: Grossly normal   Consultants:   N/A  Procedures:   N/A  Data Reviewed: I have personally reviewed following labs and imaging studies Results for orders placed or performed during the hospital encounter of 07/18/19 (from the past 24 hour(s))  CBC with Differential     Status: None   Collection Time: 07/18/19  3:12 PM  Result Value Ref Range   WBC 7.2 4.0 - 10.5 K/uL   RBC  4.82 4.22 - 5.81 MIL/uL   Hemoglobin 13.4 13.0 - 17.0 g/dL   HCT 16.140.5 39 - 52 %   MCV 84.0 80.0 - 100.0 fL   MCH 27.8 26.0 - 34.0 pg   MCHC 33.1 30.0 - 36.0 g/dL   RDW 09.613.6 04.511.5 - 40.915.5 %   Platelets 280 150 - 400 K/uL   nRBC 0.0 0.0 - 0.2 %   Neutrophils Relative % 71 %   Neutro Abs 5.0 1.7 - 7.7 K/uL   Lymphocytes Relative 19 %   Lymphs Abs 1.4 0.7 - 4.0 K/uL   Monocytes Relative 10 %   Monocytes Absolute 0.7 0 - 1 K/uL   Eosinophils Relative 0 %   Eosinophils Absolute 0.0 0 - 0 K/uL   Basophils Relative 0 %   Basophils Absolute 0.0 0 - 0 K/uL   Immature Granulocytes 0 %   Abs Immature Granulocytes 0.02 0.00 - 0.07 K/uL  Comprehensive metabolic panel     Status: Abnormal   Collection Time: 07/18/19  3:12 PM  Result Value Ref Range   Sodium 144 135 - 145 mmol/L   Potassium 3.7 3.5 - 5.1 mmol/L   Chloride 106 98 - 111 mmol/L   CO2 22 22 - 32 mmol/L   Glucose, Bld 175 (H) 70 - 99 mg/dL   BUN 27 (H) 8 - 23 mg/dL   Creatinine, Ser 8.110.67 0.61 - 1.24 mg/dL   Calcium 91.410.8 (H) 8.9 - 10.3 mg/dL   Total Protein 7.6 6.5 - 8.1 g/dL   Albumin 3.9 3.5 - 5.0 g/dL   AST 68 (H) 15 - 41 U/L   ALT 121 (H) 0 - 44 U/L   Alkaline Phosphatase 92 38 - 126 U/L   Total Bilirubin 1.2 0.3 - 1.2 mg/dL   GFR calc non Af Amer >60 >60 mL/min   GFR calc Af Amer >60 >60 mL/min   Anion gap 16 (H) 5 - 15  Urinalysis, Routine w reflex microscopic     Status: Abnormal   Collection Time: 07/18/19  3:13 PM  Result Value Ref Range   Color, Urine YELLOW YELLOW   APPearance CLEAR CLEAR   Specific Gravity, Urine 1.015 1.005 - 1.030   pH 5.5 5.0 - 8.0   Glucose, UA 100 (A) NEGATIVE mg/dL   Hgb urine dipstick NEGATIVE NEGATIVE   Bilirubin Urine NEGATIVE NEGATIVE   Ketones, ur 15 (A) NEGATIVE mg/dL   Protein, ur NEGATIVE NEGATIVE mg/dL   Nitrite NEGATIVE NEGATIVE   Leukocytes,Ua NEGATIVE NEGATIVE  Troponin I (High Sensitivity)     Status: None   Collection Time: 07/18/19  3:15 PM  Result Value Ref Range    Troponin I (High Sensitivity) <2 <18 ng/L  CBG monitoring, ED     Status: Abnormal   Collection Time: 07/18/19  3:40 PM  Result Value Ref Range   Glucose-Capillary 101 (H) 70 - 99 mg/dL  Troponin I (High Sensitivity)     Status: None   Collection Time: 07/18/19  5:50 PM  Result Value Ref  Range   Troponin I (High Sensitivity) <2 <18 ng/L  SARS Coronavirus 2 by RT PCR (hospital order, performed in [facility] hospital lab) Nasopharyngeal Nasopharyngeal Swab     Status: None   Collection Time: 07/18/19  7:33 PM   Specimen: Nasopharyngeal Swab  Result Value Ref Range   SARS Coronavirus 2 NEGATIVE NEGATIVE  CBC     Status: Abnormal   Collection Time: 07/19/19  5:55 AM  Result Value Ref Range   WBC 6.4 4.0 - 10.5 K/uL   RBC 3.88 (L) 4.22 - 5.81 MIL/uL   Hemoglobin 11.0 (L) 13.0 - 17.0 g/dL   HCT 16.1 (L) 39 - 52 %   MCV 85.6 80.0 - 100.0 fL   MCH 28.4 26.0 - 34.0 pg   MCHC 33.1 30.0 - 36.0 g/dL   RDW 09.6 04.5 - 40.9 %   Platelets 210 150 - 400 K/uL   nRBC 0.0 0.0 - 0.2 %  Comprehensive metabolic panel     Status: Abnormal   Collection Time: 07/19/19  5:55 AM  Result Value Ref Range   Sodium 146 (H) 135 - 145 mmol/L   Potassium 3.4 (L) 3.5 - 5.1 mmol/L   Chloride 112 (H) 98 - 111 mmol/L   CO2 22 22 - 32 mmol/L   Glucose, Bld 109 (H) 70 - 99 mg/dL   BUN 23 8 - 23 mg/dL   Creatinine, Ser 8.11 0.61 - 1.24 mg/dL   Calcium 9.8 8.9 - 91.4 mg/dL   Total Protein 5.8 (L) 6.5 - 8.1 g/dL   Albumin 3.2 (L) 3.5 - 5.0 g/dL   AST 43 (H) 15 - 41 U/L   ALT 82 (H) 0 - 44 U/L   Alkaline Phosphatase 66 38 - 126 U/L   Total Bilirubin 1.4 (H) 0.3 - 1.2 mg/dL   GFR calc non Af Amer >60 >60 mL/min   GFR calc Af Amer >60 >60 mL/min   Anion gap 12 5 - 15  Hemoglobin A1c     Status: Abnormal   Collection Time: 07/19/19  5:55 AM  Result Value Ref Range   Hgb A1c MFr Bld 7.0 (H) 4.8 - 5.6 %   Mean Plasma Glucose 154.2 mg/dL  Glucose, capillary     Status: Abnormal   Collection Time: 07/19/19  8:53  AM  Result Value Ref Range   Glucose-Capillary 114 (H) 70 - 99 mg/dL  PSA     Status: None   Collection Time: 07/19/19 10:31 AM  Result Value Ref Range   Prostatic Specific Antigen 1.23 0.00 - 4.00 ng/mL  Vitamin B12     Status: Abnormal   Collection Time: 07/19/19 10:31 AM  Result Value Ref Range   Vitamin B-12 1,380 (H) 180 - 914 pg/mL  Glucose, capillary     Status: Abnormal   Collection Time: 07/19/19 12:08 PM  Result Value Ref Range   Glucose-Capillary 103 (H) 70 - 99 mg/dL    Recent Results (from the past 240 hour(s))  SARS Coronavirus 2 by RT PCR (hospital order, performed in [facility] hospital lab) Nasopharyngeal Nasopharyngeal Swab     Status: None   Collection Time: 07/18/19  7:33 PM   Specimen: Nasopharyngeal Swab  Result Value Ref Range Status   SARS Coronavirus 2 NEGATIVE NEGATIVE Final    Comment: (NOTE) SARS-CoV-2 target nucleic acids are NOT DETECTED.  The SARS-CoV-2 RNA is generally detectable in upper and lower respiratory specimens during the acute phase of infection. The lowest concentration of SARS-CoV-2  viral copies this assay can detect is 250 copies / mL. A negative result does not preclude SARS-CoV-2 infection and should not be used as the sole basis for treatment or other patient management decisions.  A negative result may occur with improper specimen collection / handling, submission of specimen other than nasopharyngeal swab, presence of viral mutation(s) within the areas targeted by this assay, and inadequate number of viral copies (<250 copies / mL). A negative result must be combined with clinical observations, patient history, and epidemiological information.  Fact Sheet for Patients:   BoilerBrush.com.cy  Fact Sheet for Healthcare Providers: https://pope.com/  This test is not yet approved or  cleared by the [place] FDA and has been authorized for detection and/or diagnosis of  SARS-CoV-2 by FDA under an Emergency Use Authorization (EUA).  This EUA will remain in effect (meaning this test can be used) for the duration of the COVID-19 declaration under Section 564(b)(1) of the Act, 21 U.S.C. section 360bbb-3(b)(1), unless the authorization is terminated or revoked sooner.  Performed at [facility], [address], [place], [place] [address]      Radiology Studies: CT Head Wo Contrast  Result Date: 07/18/2019 CLINICAL DATA:  74 year old male with continued headache, dizziness and nausea following fall and head injury 2 weeks ago. Initial encounter. EXAM: CT HEAD WITHOUT CONTRAST TECHNIQUE: Contiguous axial images were obtained from the base of the skull through the vertex without intravenous contrast. COMPARISON:  None. FINDINGS: Brain: No evidence of acute infarction, hemorrhage, hydrocephalus, extra-axial collection or mass lesion/mass effect. Moderate periventricular white matter hypodensities likely represents chronic small-vessel white matter ischemic changes. Vascular: No hyperdense vessel or unexpected calcification. Skull: Normal. Negative for fracture or focal lesion. Sinuses/Orbits: No acute finding. Other: None. IMPRESSION: 1. No evidence of acute intracranial abnormality. 2. Moderate probable chronic small-vessel white matter ischemic changes. Electronically Signed   By: Harmon Pier M.D.   On: 07/18/2019 16:54   CT Angio Chest PE W and/or Wo Contrast  Result Date: 07/18/2019 CLINICAL DATA:  Larey Seat from ladder 2 weeks ago, dizziness, nausea, lethargy EXAM: CT ANGIOGRAPHY CHEST WITH CONTRAST TECHNIQUE: Multidetector CT imaging of the chest was performed using the standard protocol during bolus administration of intravenous contrast. Multiplanar CT image reconstructions and MIPs were obtained to evaluate the vascular anatomy. CONTRAST:  OMNIPAQUE IOHEXOL 350 MG/ML SOLN COMPARISON:  None. FINDINGS: Cardiovascular: This is a technically adequate  evaluation of the pulmonary vasculature. No fluid view flex or pulmonary emboli. The heart is unremarkable without pericardial effusion. The thoracic aorta is grossly normal. Mediastinum/Nodes: No enlarged mediastinal, hilar, or axillary lymph nodes. Thyroid gland, trachea, and esophagus demonstrate no significant findings. Lungs/Pleura: There is a pure ground-glass 9 mm right upper lobe subpleural nodule reference image 35/7. No other areas of airspace disease, effusion, or pneumothorax. Central airways are patent. Upper Abdomen: No acute abnormality. Musculoskeletal: No acute or destructive bony lesions. Reconstructed images demonstrate no additional findings. Review of the MIP images confirms the above findings. IMPRESSION: 1. No evidence of pulmonary embolus. 2. A 9 mm right upper lobe subpleural ground-glass nodule. Initial follow-up with CT at 6-12 months is recommended to confirm persistence. If persistent, repeat CT is recommended every 2 years until 5 years of stability has been established. This recommendation follows the consensus statement: Guidelines for Management of Incidental Pulmonary Nodules Detected on CT Images: From the Fleischner Society 2017; Radiology 2017; 284:228-243. Electronically Signed   By: Sharlet Salina M.D.   On: 07/18/2019 16:55  MR BRAIN WO CONTRAST  Result Date: 07/19/2019 CLINICAL DATA:  Dizziness. Fell from ladder 2 weeks ago with head trauma. Occipital headaches. EXAM: MRI HEAD WITHOUT CONTRAST TECHNIQUE: Multiplanar, multiecho pulse sequences of the brain and surrounding structures were obtained without intravenous contrast. COMPARISON:  Head CT 07/18/2019 FINDINGS: Brain: Diffusion imaging does not show any acute or subacute infarction. The brainstem and cerebellum are normal. Cerebral hemispheres show moderate chronic small-vessel ischemic changes of the deep white matter. No cortical or large vessel territory infarction. No mass lesion, hemorrhage, hydrocephalus or  extra-axial collection. Vascular: Major vessels at the base of the brain show flow. Skull and upper cervical spine: Negative Sinuses/Orbits: Clear/normal Other: None IMPRESSION: No acute or traumatic finding. No specific cause of the presenting symptoms is identified. Moderate chronic small-vessel ischemic changes of the cerebral hemispheric white matter. Electronically Signed   By: Paulina Fusi M.D.   On: 07/19/2019 08:03   CT ABDOMEN PELVIS W CONTRAST  Result Date: 07/18/2019 CLINICAL DATA:  Larey Seat from ladder 2 weeks ago, weight loss, decreased appetite EXAM: CT ABDOMEN AND PELVIS WITH CONTRAST TECHNIQUE: Multidetector CT imaging of the abdomen and pelvis was performed using the standard protocol following bolus administration of intravenous contrast. CONTRAST:  OMNIPAQUE IOHEXOL 350 MG/ML SOLN COMPARISON:  None. FINDINGS: Lower chest: No acute pleural or parenchymal lung disease. Hepatobiliary: No hepatic injury or perihepatic hematoma. Gallbladder is unremarkable Pancreas: Unremarkable. No pancreatic ductal dilatation or surrounding inflammatory changes. Spleen: No splenic injury or perisplenic hematoma. Adrenals/Urinary Tract: No adrenal hemorrhage or renal injury identified. Bladder is unremarkable. Stomach/Bowel: No bowel obstruction or ileus. No bowel wall thickening or inflammatory change. Vascular/Lymphatic: Aortic atherosclerosis. No enlarged abdominal or pelvic lymph nodes. Reproductive: Mildly enlarged prostate. 1.2 cm hypodense nodule posterior aspect of the prostate, nonspecific. Please correlate with PSA and physical exam findings. Other: No abdominal wall hernia or abnormality. No abdominopelvic ascites. Musculoskeletal: No acute or destructive bony lesions. Reconstructed images demonstrate no additional findings. IMPRESSION: 1. No acute intra-abdominal or intrapelvic process. 2. Mildly enlarged prostate. 1.2 cm hypodense nodule posterior aspect of the prostate, nonspecific. Please  correlate with PSA and physical exam findings. 3. Aortic Atherosclerosis (ICD10-I70.0). Electronically Signed   By: Sharlet Salina M.D.   On: 07/18/2019 16:59   DG Chest Portable 1 View  Result Date: 07/18/2019 CLINICAL DATA:  Pain status post fall from ladder. EXAM: PORTABLE CHEST 1 VIEW COMPARISON:  None. FINDINGS: There is a nodular opacity in the left mid lung zone. There is no pneumothorax. No large pleural effusion. The heart size is unremarkable. There are aortic calcifications. There is no focal infiltrate. No acute displaced fracture. IMPRESSION: 1. No acute cardiopulmonary process. 2. Nodular opacity in the left mid lung zone. Follow-up with a nonemergent outpatient two-view chest x-ray is recommended. Electronically Signed   By: Katherine Mantle M.D.   On: 07/18/2019 15:50   MR BRAIN WO CONTRAST  Final Result    CT Head Wo Contrast  Final Result    CT ABDOMEN PELVIS W CONTRAST  Final Result    CT Angio Chest PE W and/or Wo Contrast  Final Result    DG Chest Portable 1 View  Final Result       Scheduled Meds: . aspirin  325 mg Oral Daily  . atorvastatin  20 mg Oral Daily  . enoxaparin (LOVENOX) injection  40 mg Subcutaneous Q24H  . insulin aspart  0-5 Units Subcutaneous QHS  . insulin aspart  0-9 Units Subcutaneous TID WC  .  traZODone  50 mg Oral QHS   PRN Meds: acetaminophen **OR** acetaminophen, meclizine, ondansetron **OR** ondansetron (ZOFRAN) IV Continuous Infusions: . lactated ringers 100 mL/hr at 07/19/19 1239      LOS: 0 days  Time spent: Greater than 50% of the 35 minute visit was spent in counseling/coordination of care for the patient as laid out in the A&P.   Lewie Chamber, MD [facility] 07/19/2019, 1:54 PM   Contact via secure chat.  To contact the attending provider between 7A-7P or the covering provider during after hours 7P-7A, please log into the web site [email or web address] and access using universal [place] password for that web  site. If you do not have the password, please call the hospital operator.

## 2019-07-19 NOTE — Plan of Care (Signed)
POC initiated 

## 2019-07-19 NOTE — Assessment & Plan Note (Signed)
-  Continue SSI and POC glucose 

## 2019-07-19 NOTE — Assessment & Plan Note (Signed)
Stroke ruled out on MRI brain -Continue supportive care and meclizine as needed -PT consult.  No acute needs at discharge -Monitor further overnight and likely able to discharge home tomorrow

## 2019-07-19 NOTE — Assessment & Plan Note (Signed)
Continue current regimen

## 2019-07-19 NOTE — Progress Notes (Signed)
Report received form Suzanna RN/[facility]. Pt received via carelink and transferred self from stretcher to bed without difficulty. Education intitiated on use of call bell for needs/safety and pt handbook guide given to pt.

## 2019-07-19 NOTE — Hospital Course (Signed)
Gerald Black is a 74 year old male with PMH hypothyroidism, hypertension, type 2 diabetes who presents to the hospital with ongoing dizziness/vertigo.  He had stated that he fell off of a ladder approximately 2 weeks prior to admission and has been becoming weaker at home.  He also endorsed that he has had a very decreased appetite over the past 4 days prior to admission.  Reason being is due to loss of taste.  He denied any other fevers, chills, loss of smell.  He has also been vaccinated for COVID-19 and also had a negative COVID-19 test on admission. He was admitted for stroke rule out with MRI brain.  This was performed after admission and did not reveal any acute abnormalities.  It was notable for chronic small vessel disease otherwise unremarkable.  He was evaluated by physical therapy as well with no needs at discharge.

## 2019-07-19 NOTE — Evaluation (Signed)
Physical Therapy One Time Evaluation Patient Details Name: Gerald Black MRN: [id number] DOB: December 17, 1945 Today's Date: 07/19/2019   History of Present Illness  74 y.o. male with medical history significant of DM2, HTN.  Pt with progressively worsening dizziness for 4 days.  Larey Seat off of a ladder 2 weeks ago, landed on head on ground.  Presenting with vertigo symptoms.  Clinical Impression  Patient evaluated by Physical Therapy with no further acute PT needs identified. All education has been completed and the patient has no further questions.  See below for any follow-up Physical Therapy or equipment needs. PT is signing off. Thank you for this referral. Pt reports no symptoms during oculomotor exam and positional testing.  Vestibular evaluation below.  Pt able to ambulate min/guard assist and occasionally unsteady however self corrects. Pt also reports not eating and drinking normally since fall 2 weeks ago.  Pt not positive for orthostatics however HR increased to 125 bpm during ambulation.  Pt plans to d/c tomorrow and back to work in a couple days.  Pt encouraged to progress mobility slowly and sit down immediately if starting to feel dizzy with standing or mobility.     07/19/19 1327  Vestibular Assessment  General Observation Pt reports occipital headaches and stiff neck since fall off ladder and hitting the back of his head 2 weeks ago.  Pt reports not eating or drinking as much since this injury as well.  Pt reports dizziness much improved since onset with fall however still occurs with positional changes.  Pt wears bifocals for reading (farsighted).  Pt denies hearing changes and tinnitus.  Pt does report issue with memory which was also observed during session (forgot what he was talking about x3)  Symptom Behavior  Type of Dizziness  Lightheadedness  Frequency of Dizziness with positional changes  Duration of Dizziness less then 30 seconds  Symptom Nature Positional  Aggravating Factors  Supine to sit;Sit to stand  Relieving Factors Rest;Lying supine  Progression of Symptoms Better  Oculomotor Exam  Oculomotor Alignment Normal  Spontaneous Absent  Gaze-induced  Absent  Head shaking Horizontal Absent  Head Shaking Vertical Absent  Smooth Pursuits Saccades  Saccades Dysmetria;Slow  Comment difficulty with focus during oculomotor exam (wearing bifocals) however no nystagmus observed  Positional Testing  Dix-Hallpike Dix-Hallpike Right;Dix-Hallpike Left  Horizontal Canal Testing Horizontal Canal Right;Horizontal Canal Left  Dix-Hallpike Right  Dix-Hallpike Right Symptoms No nystagmus  Dix-Hallpike Left  Dix-Hallpike Left Symptoms No nystagmus  Horizontal Canal Right  Horizontal Canal Right Symptoms Normal  Horizontal Canal Left  Horizontal Canal Left Symptoms Normal  Orthostatics  BP supine (x 5 minutes) 156/68  HR supine (x 5 minutes) 103  BP sitting 154/85  HR sitting 105  BP standing (after 1 minute) 163/76  HR standing (after 1 minute) 110  BP standing (after 3 minutes) 165/83  HR standing (after 3 minutes) 119       Follow Up Recommendations No PT follow up    Equipment Recommendations  None recommended by PT    Recommendations for Other Services       Precautions / Restrictions Precautions Precautions: Fall      Mobility  Bed Mobility Overal bed mobility: Needs Assistance Bed Mobility: Supine to Sit;Sit to Supine     Supine to sit: Min assist Sit to supine: Supervision   General bed mobility comments: assist for trunk (pt states due to sensitive neck)  Transfers Overall transfer level: Needs assistance Equipment used: None Transfers: Sit to/from Stand Sit  to Stand: Min guard         General transfer comment: instability upon standing and dizziness so min/guard for safety however dizziness resolved within 15 seconds  Ambulation/Gait Ambulation/Gait assistance: Min guard Gait Distance (Feet): 300 Feet Assistive device:  None Gait Pattern/deviations: Narrow base of support;Decreased stride length     General Gait Details: occasional instability ("tripping" per pt) however pt able to self correct, no physical assist required  Stairs            Wheelchair Mobility    Modified Rankin (Stroke Patients Only)       Balance Overall balance assessment: Needs assistance         Standing balance support: No upper extremity supported Standing balance-Leahy Scale: Good               High level balance activites: Direction changes;Turns;Head turns;Sudden stops High Level Balance Comments: no assist required however slows down for balance activities; denies any falls prior to falling off ladder             Pertinent Vitals/Pain Pain Assessment: Faces Faces Pain Scale: Hurts a little bit Pain Location: neck Pain Descriptors / Indicators: Guarding;Other (Comment) (stiff)    Home Living Family/patient expects to be discharged to:: Private residence Living Arrangements: Spouse/significant other     Home Access: Level entry     Home Layout: One level Home Equipment: None      Prior Function Level of Independence: Independent         Comments: 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 [employer]        Extremity/Trunk Assessment   Upper Extremity Assessment Upper Extremity Assessment: Overall WFL for tasks assessed    Lower Extremity Assessment Lower Extremity Assessment: Overall WFL for tasks assessed    Cervical / Trunk Assessment Cervical / Trunk Assessment: Other exceptions Cervical / Trunk Exceptions: AROM right neck rotation more limited then left, pt reports increased neck stiffness/sore since fall and hitting the back of his head  Communication   Communication: No difficulties  Cognition Arousal/Alertness: Awake/alert Behavior During Therapy: WFL for tasks assessed/performed Overall Cognitive Status: Within Functional Limits for tasks assessed                                         General Comments      Exercises     Assessment/Plan    PT Assessment Patent does not need any further PT services  PT Problem List         PT Treatment Interventions      PT Goals (Current goals can be found in the Care Plan section)  Acute Rehab PT Goals PT Goal Formulation: All assessment and education complete, DC therapy    Frequency     Barriers to discharge        Co-evaluation               AM-PAC PT "6 Clicks" Mobility  Outcome Measure Help needed turning from your back to your side while in a flat bed without using bedrails?: None Help needed moving from lying on your back to sitting on the side of a flat bed without using bedrails?: None Help needed moving to and from a bed to a chair (including a wheelchair)?: None Help needed standing up from a chair using your arms (e.g., wheelchair or bedside chair)?: A  Little Help needed to walk in hospital room?: A Little Help needed climbing 3-5 steps with a railing? : A Little 6 Click Score: 21    End of Session Equipment Utilized During Treatment: Gait belt Activity Tolerance: Patient tolerated treatment well Patient left: in bed;with call bell/phone within reach;with bed alarm set   PT Visit Diagnosis: Unsteadiness on feet (R26.81)    Time: 6503-5465 PT Time Calculation (min) (ACUTE ONLY): 30 min   Charges:   PT Evaluation $PT Eval Moderate Complexity: 1 Mod PT Treatments $Gait Training: 8-22 mins   Paulino Door, DPT Acute Rehabilitation Services Pager: [phone] Office: [phone]  Maida Sale E 07/19/2019, 1:45 PM

## 2019-07-19 NOTE — Assessment & Plan Note (Signed)
Continue antiemetic -See vertigo

## 2019-07-20 DIAGNOSIS — R42 Dizziness and giddiness: Principal | ICD-10-CM

## 2019-07-20 LAB — CBC WITH DIFFERENTIAL/PLATELET
Abs Immature Granulocytes: 0.02 10*3/uL (ref 0.00–0.07)
Basophils Absolute: 0 10*3/uL (ref 0.0–0.1)
Basophils Relative: 1 %
Eosinophils Absolute: 0.1 10*3/uL (ref 0.0–0.5)
Eosinophils Relative: 2 %
HCT: 34.9 % — ABNORMAL LOW (ref 39.0–52.0)
Hemoglobin: 11.1 g/dL — ABNORMAL LOW (ref 13.0–17.0)
Immature Granulocytes: 0 %
Lymphocytes Relative: 19 %
Lymphs Abs: 1.2 10*3/uL (ref 0.7–4.0)
MCH: 27.5 pg (ref 26.0–34.0)
MCHC: 31.8 g/dL (ref 30.0–36.0)
MCV: 86.4 fL (ref 80.0–100.0)
Monocytes Absolute: 0.7 10*3/uL (ref 0.1–1.0)
Monocytes Relative: 11 %
Neutro Abs: 4.4 10*3/uL (ref 1.7–7.7)
Neutrophils Relative %: 67 %
Platelets: 202 10*3/uL (ref 150–400)
RBC: 4.04 MIL/uL — ABNORMAL LOW (ref 4.22–5.81)
RDW: 13.7 % (ref 11.5–15.5)
WBC: 6.5 10*3/uL (ref 4.0–10.5)
nRBC: 0 % (ref 0.0–0.2)

## 2019-07-20 LAB — MAGNESIUM: Magnesium: 1.5 mg/dL — ABNORMAL LOW (ref 1.7–2.4)

## 2019-07-20 LAB — BASIC METABOLIC PANEL
Anion gap: 13 (ref 5–15)
BUN: 18 mg/dL (ref 8–23)
CO2: 22 mmol/L (ref 22–32)
Calcium: 10.1 mg/dL (ref 8.9–10.3)
Chloride: 111 mmol/L (ref 98–111)
Creatinine, Ser: 0.54 mg/dL — ABNORMAL LOW (ref 0.61–1.24)
GFR calc Af Amer: >60 mL/min (ref >60)
GFR calc non Af Amer: >60 mL/min (ref >60)
Glucose, Bld: 97 mg/dL (ref 70–99)
Potassium: 3.3 mmol/L — ABNORMAL LOW (ref 3.5–5.1)
Sodium: 146 mmol/L — ABNORMAL HIGH (ref 135–145)

## 2019-07-20 LAB — GLUCOSE, CAPILLARY
Glucose-Capillary: 89 mg/dL (ref 70–99)
Glucose-Capillary: 76 mg/dL (ref 70–99)

## 2019-07-20 MED ORDER — ONDANSETRON HCL 4 MG PO TABS: 4.0000 mg | ORAL_TABLET | Freq: Four times a day (QID) | ORAL | 0 refills | Status: AC | PRN

## 2019-07-20 MED ORDER — MECLIZINE HCL 25 MG PO TABS: 25.0000 mg | ORAL_TABLET | Freq: Three times a day (TID) | ORAL | 0 refills | Status: AC | PRN

## 2019-07-20 NOTE — Discharge Summary (Signed)
Physician Discharge Summary  Gerald Black TWS:[id number] DOB: [birth year] DOA: 07/18/2019  PCP: System, Pcp Not In  Admit date: 07/18/2019 Discharge date: 07/20/2019  Admitted From: Home Disposition: Home  Recommendations for Outpatient Follow-up:  1. Follow up with PCP in 1-2 weeks 2. Please obtain BMP/CBC in one week  Discharge Condition: Stable CODE STATUS: Full Diet recommendation: As tolerated  Brief/Interim Summary: [name] is a 74 year old male with PMH hypothyroidism, hypertension, type 2 diabetes who presents to the hospital with ongoing dizziness/vertigo.  He had stated that he fell off of a ladder approximately 2 weeks prior to admission and has been becoming weaker at home.  He also endorsed that he has had a very decreased appetite over the past 4 days prior to admission.  Reason being is due to loss of taste.  He denied any other fevers, chills, loss of smell.  He has also been vaccinated for COVID-19 and also had a negative COVID-19 test on admission. He was admitted for stroke rule out with MRI brain.  This was performed after admission and did not reveal any acute abnormalities.  It was notable for chronic small vessel disease otherwise unremarkable.  Patient admitted as above with acute presyncopal event with dizziness and vertigo without clear loss of consciousness noted to have profoundly positive orthostatic vital signs in the ED, patient's symptoms and blood pressure have markedly improved with IV fluid, now tolerating p.o. adequately and otherwise denies chest pain, nausea, vomiting, diarrhea, constipation, headache, fevers, chills.  Given resolution of symptoms with IV fluids we discussed need for increasing p.o. intake at home including free water.  Patient otherwise to have close follow-up with PCP in 1 to 2 weeks as scheduled.  Discharge Diagnoses:  Principal Problem:   Vertigo Active Problems:   Nausea & vomiting   Transaminitis   DM2 (diabetes mellitus, type 2)  (HCC)   HTN (hypertension)   Pulmonary nodule   Prostate nodule    Discharge Instructions  Discharge Instructions    Call MD for:  difficulty breathing, headache or visual disturbances   Complete by: As directed    Call MD for:  difficulty breathing, headache or visual disturbances   Complete by: As directed    Call MD for:  extreme fatigue   Complete by: As directed    Call MD for:  extreme fatigue   Complete by: As directed    Call MD for:  hives   Complete by: As directed    Call MD for:  hives   Complete by: As directed    Call MD for:  persistant dizziness or light-headedness   Complete by: As directed    Call MD for:  persistant dizziness or light-headedness   Complete by: As directed    Call MD for:  persistant nausea and vomiting   Complete by: As directed    Call MD for:  persistant nausea and vomiting   Complete by: As directed    Call MD for:  severe uncontrolled pain   Complete by: As directed    Call MD for:  severe uncontrolled pain   Complete by: As directed    Call MD for:  temperature >100.4   Complete by: As directed    Call MD for:  temperature >100.4   Complete by: As directed    Diet - low sodium heart healthy   Complete by: As directed    Increase activity slowly   Complete by: As directed    Increase activity slowly  Complete by: As directed      Allergies as of 07/20/2019   No Known Allergies     Medication List    TAKE these medications   aspirin 325 MG EC tablet Take 325 mg by mouth daily.   atorvastatin 20 MG tablet Commonly known as: LIPITOR Take 20 mg by mouth at bedtime.   meclizine 25 MG tablet Commonly known as: ANTIVERT Take 1 tablet (25 mg total) by mouth 3 (three) times daily as needed for dizziness.   metFORMIN 500 MG tablet Commonly known as: GLUCOPHAGE Take 500 mg by mouth 2 (two) times daily with a meal.   ondansetron 4 MG tablet Commonly known as: ZOFRAN Take 1 tablet (4 mg total) by mouth every 6 (six)  hours as needed for nausea.   traZODone 50 MG tablet Commonly known as: DESYREL Take 50 mg by mouth at bedtime.       No Known Allergies  Consultations:  None   Procedures/Studies: CT Head Wo Contrast  Result Date: 07/18/2019 CLINICAL DATA:  74 year old male with continued headache, dizziness and nausea following fall and head injury 2 weeks ago. Initial encounter. EXAM: CT HEAD WITHOUT CONTRAST TECHNIQUE: Contiguous axial images were obtained from the base of the skull through the vertex without intravenous contrast. COMPARISON:  None. FINDINGS: Brain: No evidence of acute infarction, hemorrhage, hydrocephalus, extra-axial collection or mass lesion/mass effect. Moderate periventricular white matter hypodensities likely represents chronic small-vessel white matter ischemic changes. Vascular: No hyperdense vessel or unexpected calcification. Skull: Normal. Negative for fracture or focal lesion. Sinuses/Orbits: No acute finding. Other: None. IMPRESSION: 1. No evidence of acute intracranial abnormality. 2. Moderate probable chronic small-vessel white matter ischemic changes. Electronically Signed   By: Harmon Pier M.D.   On: 07/18/2019 16:54   CT Angio Chest PE W and/or Wo Contrast  Result Date: 07/18/2019 CLINICAL DATA:  Larey Seat from ladder 2 weeks ago, dizziness, nausea, lethargy EXAM: CT ANGIOGRAPHY CHEST WITH CONTRAST TECHNIQUE: Multidetector CT imaging of the chest was performed using the standard protocol during bolus administration of intravenous contrast. Multiplanar CT image reconstructions and MIPs were obtained to evaluate the vascular anatomy. CONTRAST:  OMNIPAQUE IOHEXOL 350 MG/ML SOLN COMPARISON:  None. FINDINGS: Cardiovascular: This is a technically adequate evaluation of the pulmonary vasculature. No fluid view flex or pulmonary emboli. The heart is unremarkable without pericardial effusion. The thoracic aorta is grossly normal. Mediastinum/Nodes: No enlarged mediastinal,  hilar, or axillary lymph nodes. Thyroid gland, trachea, and esophagus demonstrate no significant findings. Lungs/Pleura: There is a pure ground-glass 9 mm right upper lobe subpleural nodule reference image 35/7. No other areas of airspace disease, effusion, or pneumothorax. Central airways are patent. Upper Abdomen: No acute abnormality. Musculoskeletal: No acute or destructive bony lesions. Reconstructed images demonstrate no additional findings. Review of the MIP images confirms the above findings. IMPRESSION: 1. No evidence of pulmonary embolus. 2. A 9 mm right upper lobe subpleural ground-glass nodule. Initial follow-up with CT at 6-12 months is recommended to confirm persistence. If persistent, repeat CT is recommended every 2 years until 5 years of stability has been established. This recommendation follows the consensus statement: Guidelines for Management of Incidental Pulmonary Nodules Detected on CT Images: From the Fleischner Society 2017; Radiology 2017; 284:228-243. Electronically Signed   By: Sharlet Salina M.D.   On: 07/18/2019 16:55   MR BRAIN WO CONTRAST  Result Date: 07/19/2019 CLINICAL DATA:  Dizziness. Fell from ladder 2 weeks ago with head trauma. Occipital headaches. EXAM: MRI HEAD WITHOUT CONTRAST  TECHNIQUE: Multiplanar, multiecho pulse sequences of the brain and surrounding structures were obtained without intravenous contrast. COMPARISON:  Head CT 07/18/2019 FINDINGS: Brain: Diffusion imaging does not show any acute or subacute infarction. The brainstem and cerebellum are normal. Cerebral hemispheres show moderate chronic small-vessel ischemic changes of the deep white matter. No cortical or large vessel territory infarction. No mass lesion, hemorrhage, hydrocephalus or extra-axial collection. Vascular: Major vessels at the base of the brain show flow. Skull and upper cervical spine: Negative Sinuses/Orbits: Clear/normal Other: None IMPRESSION: No acute or traumatic finding. No specific  cause of the presenting symptoms is identified. Moderate chronic small-vessel ischemic changes of the cerebral hemispheric white matter. Electronically Signed   By: Paulina Fusi M.D.   On: 07/19/2019 08:03   CT ABDOMEN PELVIS W CONTRAST  Result Date: 07/18/2019 CLINICAL DATA:  Larey Seat from ladder 2 weeks ago, weight loss, decreased appetite EXAM: CT ABDOMEN AND PELVIS WITH CONTRAST TECHNIQUE: Multidetector CT imaging of the abdomen and pelvis was performed using the standard protocol following bolus administration of intravenous contrast. CONTRAST:  OMNIPAQUE IOHEXOL 350 MG/ML SOLN COMPARISON:  None. FINDINGS: Lower chest: No acute pleural or parenchymal lung disease. Hepatobiliary: No hepatic injury or perihepatic hematoma. Gallbladder is unremarkable Pancreas: Unremarkable. No pancreatic ductal dilatation or surrounding inflammatory changes. Spleen: No splenic injury or perisplenic hematoma. Adrenals/Urinary Tract: No adrenal hemorrhage or renal injury identified. Bladder is unremarkable. Stomach/Bowel: No bowel obstruction or ileus. No bowel wall thickening or inflammatory change. Vascular/Lymphatic: Aortic atherosclerosis. No enlarged abdominal or pelvic lymph nodes. Reproductive: Mildly enlarged prostate. 1.2 cm hypodense nodule posterior aspect of the prostate, nonspecific. Please correlate with PSA and physical exam findings. Other: No abdominal wall hernia or abnormality. No abdominopelvic ascites. Musculoskeletal: No acute or destructive bony lesions. Reconstructed images demonstrate no additional findings. IMPRESSION: 1. No acute intra-abdominal or intrapelvic process. 2. Mildly enlarged prostate. 1.2 cm hypodense nodule posterior aspect of the prostate, nonspecific. Please correlate with PSA and physical exam findings. 3. Aortic Atherosclerosis (ICD10-I70.0). Electronically Signed   By: Sharlet Salina M.D.   On: 07/18/2019 16:59   DG Chest Portable 1 View  Result Date: 07/18/2019 CLINICAL DATA:   Pain status post fall from ladder. EXAM: PORTABLE CHEST 1 VIEW COMPARISON:  None. FINDINGS: There is a nodular opacity in the left mid lung zone. There is no pneumothorax. No large pleural effusion. The heart size is unremarkable. There are aortic calcifications. There is no focal infiltrate. No acute displaced fracture. IMPRESSION: 1. No acute cardiopulmonary process. 2. Nodular opacity in the left mid lung zone. Follow-up with a nonemergent outpatient two-view chest x-ray is recommended. Electronically Signed   By: Katherine Mantle M.D.   On: 07/18/2019 15:50     Subjective: No acute issues or events overnight   Discharge Exam: Vitals:   07/20/19 0358 07/20/19 0608  BP: (!) 167/76 (!) 179/82  Pulse: (!) 101 (!) 108  Resp: 18   Temp: 97.6 F (36.4 C) 98.7 F (37.1 C)  SpO2: 99% 97%   Vitals:   07/19/19 1635 07/19/19 2017 07/20/19 0358 07/20/19 0608  BP: (!) 154/70 (!) 159/65 (!) 167/76 (!) 179/82  Pulse: 99 97 (!) 101 (!) 108  Resp: (!) 25 18 18    Temp: 98 F (36.7 C) 98.3 F (36.8 C) 97.6 F (36.4 C) 98.7 F (37.1 C)  TempSrc: Oral  Oral Oral  SpO2: 99% 100% 99% 97%  Weight:      Height:        General: Pt  is alert, awake, not in acute distress Cardiovascular: RRR, S1/S2 +, no rubs, no gallops Respiratory: CTA bilaterally, no wheezing, no rhonchi Abdominal: Soft, NT, ND, bowel sounds + Extremities: no edema, no cyanosis    The results of significant diagnostics from this hospitalization (including imaging, microbiology, ancillary and laboratory) are listed below for reference.     Microbiology: Recent Results (from the past 240 hour(s))  SARS Coronavirus 2 by RT PCR (hospital order, performed in [facility] lab) Nasopharyngeal Nasopharyngeal Swab     Status: None   Collection Time: 07/18/19  7:33 PM   Specimen: Nasopharyngeal Swab  Result Value Ref Range Status   SARS Coronavirus 2 NEGATIVE NEGATIVE Final    Comment: (NOTE) SARS-CoV-2 target nucleic  acids are NOT DETECTED.  The SARS-CoV-2 RNA is generally detectable in upper and lower respiratory specimens during the acute phase of infection. The lowest concentration of SARS-CoV-2 viral copies this assay can detect is 250 copies / mL. A negative result does not preclude SARS-CoV-2 infection and should not be used as the sole basis for treatment or other patient management decisions.  A negative result may occur with improper specimen collection / handling, submission of specimen other than nasopharyngeal swab, presence of viral mutation(s) within the areas targeted by this assay, and inadequate number of viral copies (<250 copies / mL). A negative result must be combined with clinical observations, patient history, and epidemiological information.  Fact Sheet for Patients:   BoilerBrush.com.cyhttps://www.fda.gov/media/136312/download  Fact Sheet for Healthcare Providers: https://pope.com/https://www.fda.gov/media/136313/download  This test is not yet approved or  cleared by the [place] FDA and has been authorized for detection and/or diagnosis of SARS-CoV-2 by FDA under an Emergency Use Authorization (EUA).  This EUA will remain in effect (meaning this test can be used) for the duration of the COVID-19 declaration under Section 564(b)(1) of the Act, 21 U.S.C. section 360bbb-3(b)(1), unless the authorization is terminated or revoked sooner.  Performed at [facility] [place], [address] [place], [place] [id number]      Labs: BNP (last 3 results) No results for input(s): BNP in the last 8760 hours. Basic Metabolic Panel: Recent Labs  Lab 07/18/19 1512 07/19/19 0555 07/20/19 0612  NA 144 146* 146*  K 3.7 3.4* 3.3*  CL 106 112* 111  CO2 22 22 22   GLUCOSE 175* 109* 97  BUN 27* 23 18  CREATININE 0.67 0.65 0.54*  CALCIUM 10.8* 9.8 10.1  MG  --   --  1.5*   Liver Function Tests: Recent Labs  Lab 07/18/19 1512 07/19/19 0555  AST 68* 43*  ALT 121* 82*  ALKPHOS 92 66  BILITOT 1.2 1.4*   PROT 7.6 5.8*  ALBUMIN 3.9 3.2*   No results for input(s): LIPASE, AMYLASE in the last 168 hours. No results for input(s): AMMONIA in the last 168 hours. CBC: Recent Labs  Lab 07/18/19 1512 07/19/19 0555 07/20/19 0612  WBC 7.2 6.4 6.5  NEUTROABS 5.0  --  4.4  HGB 13.4 11.0* 11.1*  HCT 40.5 33.2* 34.9*  MCV 84.0 85.6 86.4  PLT 280 210 202   Cardiac Enzymes: No results for input(s): CKTOTAL, CKMB, CKMBINDEX, TROPONINI in the last 168 hours. BNP: Invalid input(s): POCBNP CBG: Recent Labs  Lab 07/19/19 1208 07/19/19 1632 07/19/19 2026 07/20/19 0800 07/20/19 1131  GLUCAP 103* 114* 120* 89 76   D-Dimer No results for input(s): DDIMER in the last 72 hours. Hgb A1c Recent Labs    07/19/19 0555  HGBA1C 7.0*  Lipid Profile No results for input(s): CHOL, HDL, LDLCALC, TRIG, CHOLHDL, LDLDIRECT in the last 72 hours. Thyroid function studies No results for input(s): TSH, T4TOTAL, T3FREE, THYROIDAB in the last 72 hours.  Invalid input(s): FREET3 Anemia work up Recent Labs    07/19/19 1031  VITAMINB12 1,380*   Urinalysis    Component Value Date/Time   COLORURINE YELLOW 07/18/2019 1513   APPEARANCEUR CLEAR 07/18/2019 1513   LABSPEC 1.015 07/18/2019 1513   PHURINE 5.5 07/18/2019 1513   GLUCOSEU 100 (A) 07/18/2019 1513   HGBUR NEGATIVE 07/18/2019 1513   BILIRUBINUR NEGATIVE 07/18/2019 1513   KETONESUR 15 (A) 07/18/2019 1513   PROTEINUR NEGATIVE 07/18/2019 1513   NITRITE NEGATIVE 07/18/2019 1513   LEUKOCYTESUR NEGATIVE 07/18/2019 1513   Sepsis Labs Invalid input(s): PROCALCITONIN,  WBC,  LACTICIDVEN Microbiology Recent Results (from the past 240 hour(s))  SARS Coronavirus 2 by RT PCR (hospital order, performed in [facility] hospital lab) Nasopharyngeal Nasopharyngeal Swab     Status: None   Collection Time: 07/18/19  7:33 PM   Specimen: Nasopharyngeal Swab  Result Value Ref Range Status   SARS Coronavirus 2 NEGATIVE NEGATIVE Final    Comment:  (NOTE) SARS-CoV-2 target nucleic acids are NOT DETECTED.  The SARS-CoV-2 RNA is generally detectable in upper and lower respiratory specimens during the acute phase of infection. The lowest concentration of SARS-CoV-2 viral copies this assay can detect is 250 copies / mL. A negative result does not preclude SARS-CoV-2 infection and should not be used as the sole basis for treatment or other patient management decisions.  A negative result may occur with improper specimen collection / handling, submission of specimen other than nasopharyngeal swab, presence of viral mutation(s) within the areas targeted by this assay, and inadequate number of viral copies (<250 copies / mL). A negative result must be combined with clinical observations, patient history, and epidemiological information.  Fact Sheet for Patients:   BoilerBrush.com.cy  Fact Sheet for Healthcare Providers: https://pope.com/  This test is not yet approved or  cleared by the [place] FDA and has been authorized for detection and/or diagnosis of SARS-CoV-2 by FDA under an Emergency Use Authorization (EUA).  This EUA will remain in effect (meaning this test can be used) for the duration of the COVID-19 declaration under Section 564(b)(1) of the Act, 21 U.S.C. section 360bbb-3(b)(1), unless the authorization is terminated or revoked sooner.  Performed at [facility], [address] [place], [place] [address]      Time coordinating discharge: Over 30 minutes  SIGNED:   Azucena Fallen, DO [facility] 07/20/2019, 12:55 PM

## 2020-11-19 IMAGING — CT CT ABD-PELV W/ CM
2 of 6 series · 16 of 46 positions shown, 18 images · IV contrast (omnipaque)
Comparison: None.

CLINICAL DATA: Fell from ladder 2 weeks ago, weight loss, decreased
appetite

EXAM:
CT ABDOMEN AND PELVIS WITH CONTRAST
TECHNIQUE: Multidetector CT imaging of the abdomen and pelvis was performed
using the standard protocol following bolus administration of
intravenous contrast.
CONTRAST:  100mL OMNIPAQUE IOHEXOL 350 MG/ML SOLN

[Series 5: axial st · axial · 0.73mm/px · z∈[-872,-468]mm · 13 of 93 slices shown, 15 images]
[im 6/93  soft-tissue]
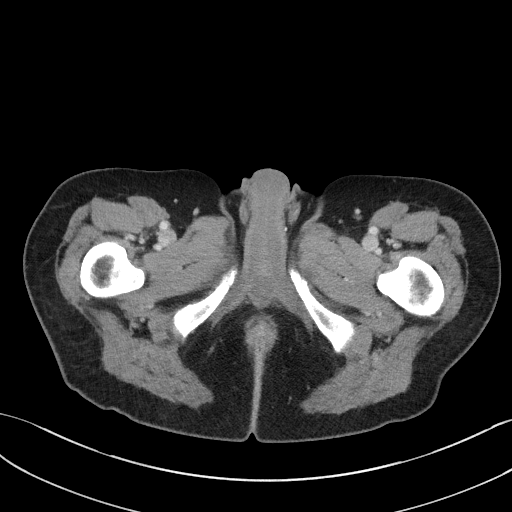
[im 6/93  bone]
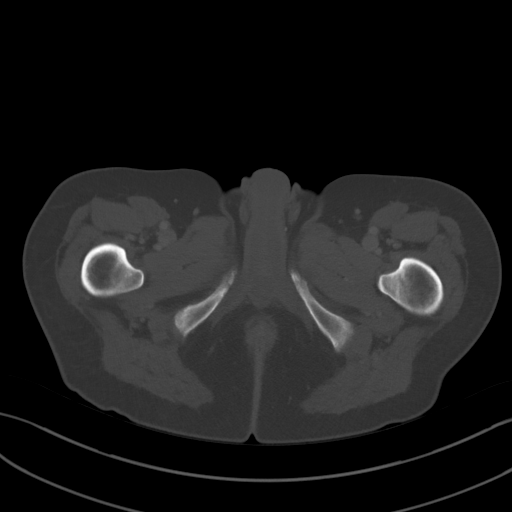
[im 11/93  soft-tissue]
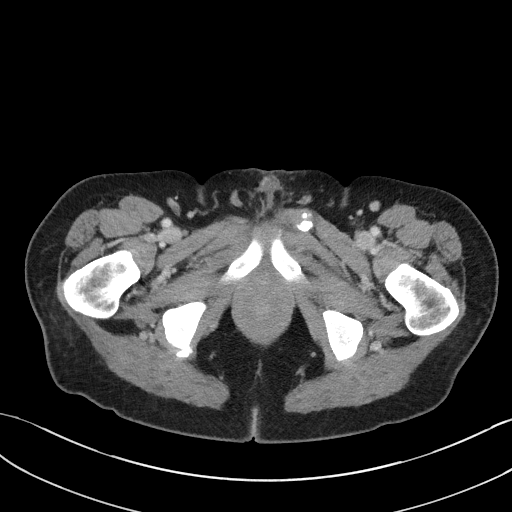
[im 22/93  soft-tissue]
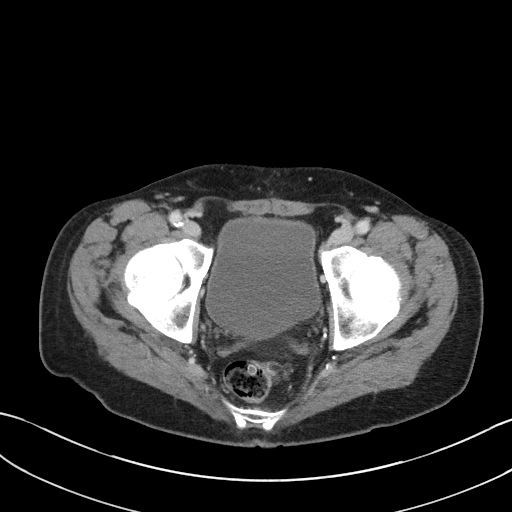
[im 28/93  soft-tissue]
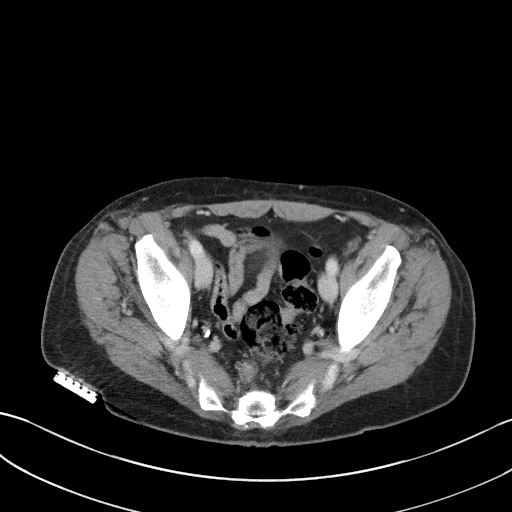
[im 33/93  soft-tissue]
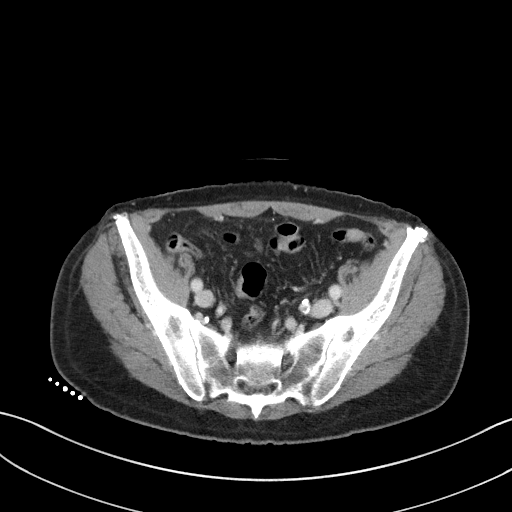
[im 38/93  soft-tissue]
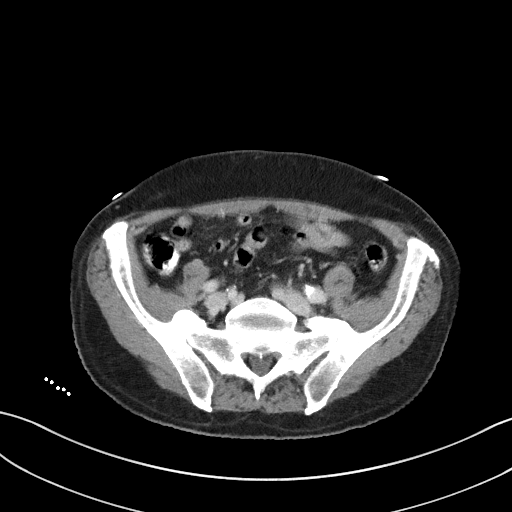
[im 49/93  soft-tissue]
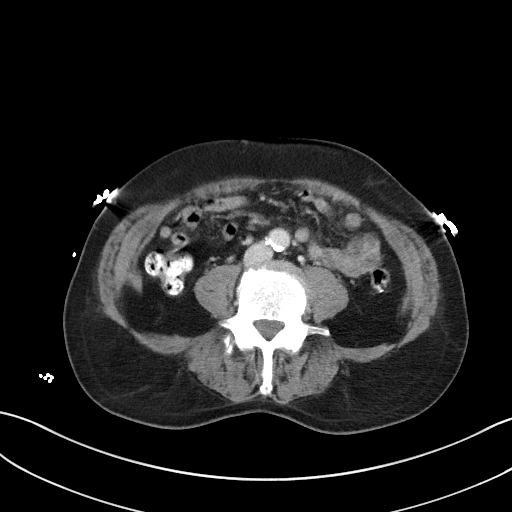
[im 55/93  soft-tissue]
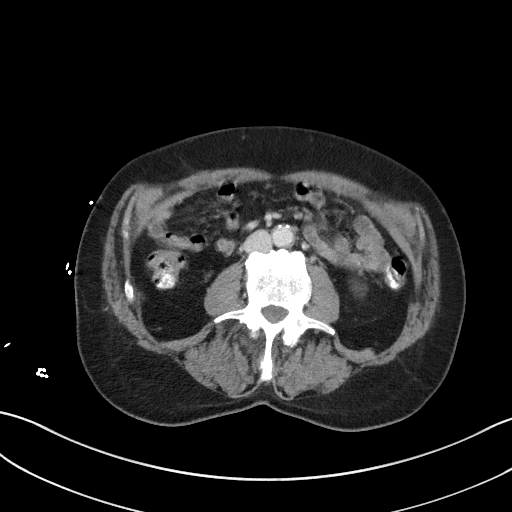
[im 60/93  soft-tissue]
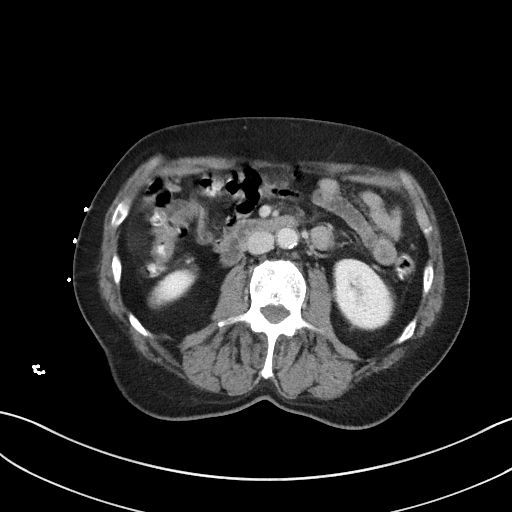
[im 60/93  bone]
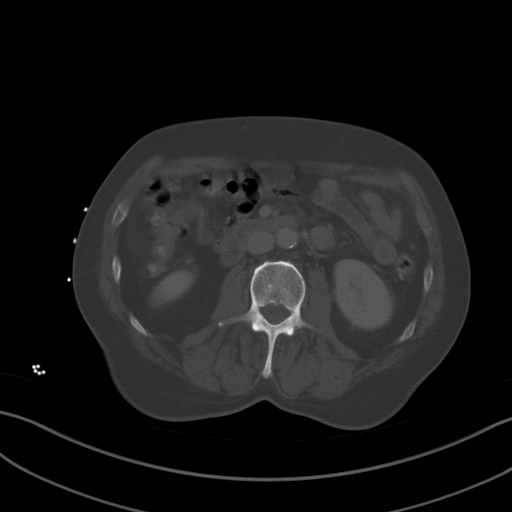
[im 65/93  soft-tissue]
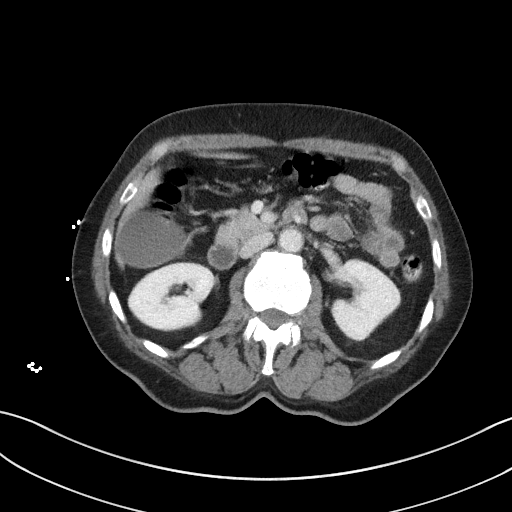
[im 71/93  soft-tissue]
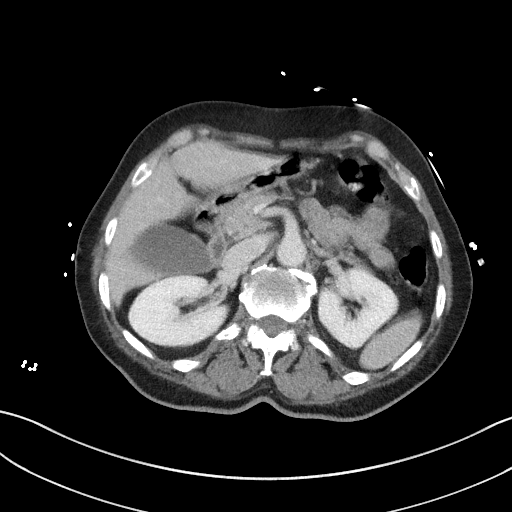
[im 82/93  soft-tissue]
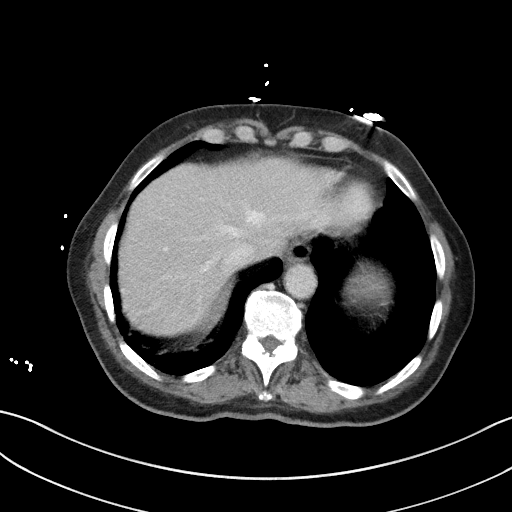
[im 87/93  soft-tissue]
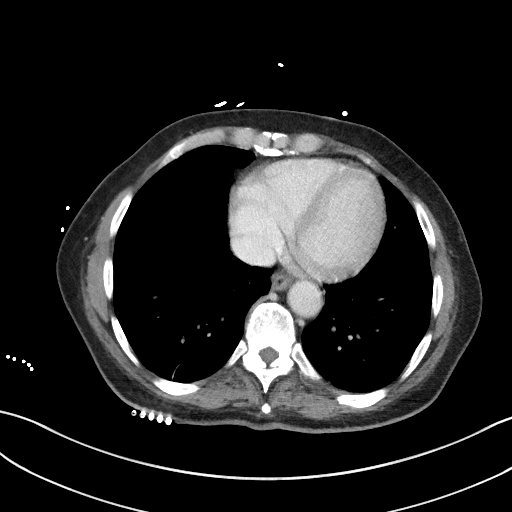

[Series 8: abd/pel coronal st · coronal · 0.73mm/px · 3 of 80 slices shown]
[im 27/80  soft-tissue]
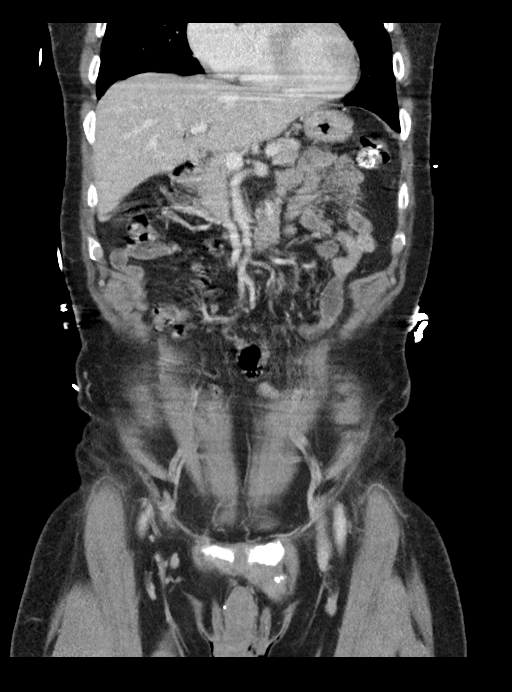
[im 36/80  soft-tissue]
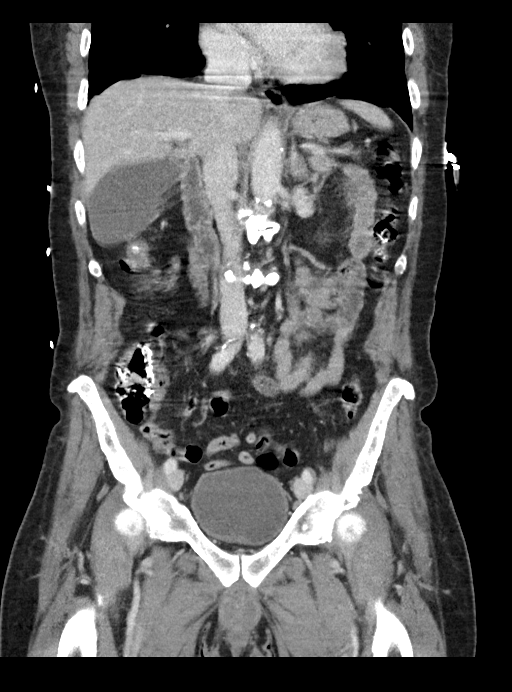
[im 44/80  soft-tissue]
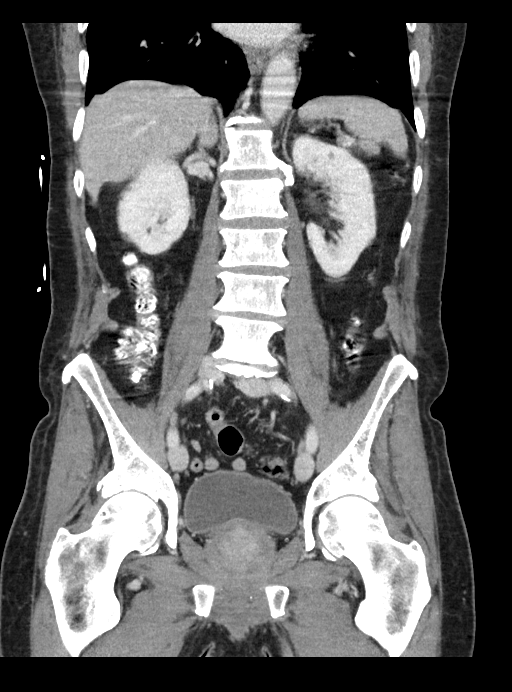

[16 of 46 positions shown; findings below may reference images not displayed]

FINDINGS: Lower chest: No acute pleural or parenchymal lung disease.

Hepatobiliary: No hepatic injury or perihepatic hematoma.
Gallbladder is unremarkable

Pancreas: Unremarkable. No pancreatic ductal dilatation or
surrounding inflammatory changes.

Spleen: No splenic injury or perisplenic hematoma.

Adrenals/Urinary Tract: No adrenal hemorrhage or renal injury
identified. Bladder is unremarkable.

Stomach/Bowel: No bowel obstruction or ileus. No bowel wall
thickening or inflammatory change.

Vascular/Lymphatic: Aortic atherosclerosis. No enlarged abdominal or
pelvic lymph nodes.

Reproductive: Mildly enlarged prostate. 1.2 cm hypodense nodule
posterior aspect of the prostate, nonspecific. Please correlate with
PSA and physical exam findings.

Other: No abdominal wall hernia or abnormality. No abdominopelvic
ascites.

Musculoskeletal: No acute or destructive bony lesions. Reconstructed
images demonstrate no additional findings.
IMPRESSION: 1. No acute intra-abdominal or intrapelvic process.
2. Mildly enlarged prostate. 1.2 cm hypodense nodule posterior
aspect of the prostate, nonspecific. Please correlate with PSA and
physical exam findings.
3. Aortic Atherosclerosis ([id number]).
